# Patient Record
Sex: Female | Born: 1970 | Race: White | Hispanic: No | Marital: Single | Smoking: Never smoker
Health system: Southern US, Community
[De-identification: ages and names within clinical notes are randomized; demographics above are authoritative.]

## PROBLEM LIST (undated history)

## (undated) DIAGNOSIS — R569 Unspecified convulsions: Secondary | ICD-10-CM

## (undated) DIAGNOSIS — E669 Obesity, unspecified: Secondary | ICD-10-CM

## (undated) DIAGNOSIS — G43009 Migraine without aura, not intractable, without status migrainosus: Secondary | ICD-10-CM

## (undated) DIAGNOSIS — G40209 Localization-related (focal) (partial) symptomatic epilepsy and epileptic syndromes with complex partial seizures, not intractable, without status epilepticus: Secondary | ICD-10-CM

## (undated) DIAGNOSIS — K219 Gastro-esophageal reflux disease without esophagitis: Secondary | ICD-10-CM

## (undated) DIAGNOSIS — R413 Other amnesia: Secondary | ICD-10-CM

## (undated) HISTORY — DX: Localization-related (focal) (partial) symptomatic epilepsy and epileptic syndromes with complex partial seizures, not intractable, without status epilepticus: G40.209

## (undated) HISTORY — PX: TONSILLECTOMY: SUR1361

## (undated) HISTORY — DX: Migraine without aura, not intractable, without status migrainosus: G43.009

## (undated) HISTORY — DX: Gastro-esophageal reflux disease without esophagitis: K21.9

## (undated) HISTORY — DX: Unspecified convulsions: R56.9

## (undated) HISTORY — DX: Other amnesia: R41.3

## (undated) HISTORY — DX: Obesity, unspecified: E66.9

---

## 1997-11-21 ENCOUNTER — Emergency Department (HOSPITAL_COMMUNITY): Admission: EM | Admit: 1997-11-21 | Discharge: 1997-11-22 | Payer: Self-pay | Admitting: Emergency Medicine

## 1998-02-02 ENCOUNTER — Emergency Department (HOSPITAL_COMMUNITY): Admission: EM | Admit: 1998-02-02 | Discharge: 1998-02-02 | Payer: Self-pay | Admitting: Emergency Medicine

## 2002-09-26 ENCOUNTER — Emergency Department (HOSPITAL_COMMUNITY): Admission: EM | Admit: 2002-09-26 | Discharge: 2002-09-27 | Payer: Self-pay | Admitting: Emergency Medicine

## 2003-03-08 ENCOUNTER — Emergency Department (HOSPITAL_COMMUNITY): Admission: EM | Admit: 2003-03-08 | Discharge: 2003-03-09 | Payer: Self-pay | Admitting: Emergency Medicine

## 2003-03-08 ENCOUNTER — Encounter: Payer: Self-pay | Admitting: Emergency Medicine

## 2003-03-11 ENCOUNTER — Emergency Department (HOSPITAL_COMMUNITY): Admission: AD | Admit: 2003-03-11 | Discharge: 2003-03-11 | Payer: Self-pay | Admitting: Emergency Medicine

## 2003-07-07 ENCOUNTER — Emergency Department (HOSPITAL_COMMUNITY): Admission: EM | Admit: 2003-07-07 | Discharge: 2003-07-08 | Payer: Self-pay

## 2004-11-02 ENCOUNTER — Encounter: Admission: RE | Admit: 2004-11-02 | Discharge: 2004-11-02 | Payer: Self-pay | Admitting: Neurology

## 2004-11-14 ENCOUNTER — Encounter: Admission: RE | Admit: 2004-11-14 | Discharge: 2004-11-14 | Payer: Self-pay | Admitting: Neurology

## 2005-07-05 ENCOUNTER — Emergency Department (HOSPITAL_COMMUNITY): Admission: EM | Admit: 2005-07-05 | Discharge: 2005-07-05 | Payer: Self-pay | Admitting: *Deleted

## 2005-07-31 ENCOUNTER — Emergency Department (HOSPITAL_COMMUNITY): Admission: EM | Admit: 2005-07-31 | Discharge: 2005-08-01 | Payer: Self-pay | Admitting: Emergency Medicine

## 2005-08-07 ENCOUNTER — Encounter: Admission: RE | Admit: 2005-08-07 | Discharge: 2005-08-07 | Payer: Self-pay | Admitting: Neurology

## 2006-03-08 ENCOUNTER — Emergency Department (HOSPITAL_COMMUNITY): Admission: EM | Admit: 2006-03-08 | Discharge: 2006-03-08 | Payer: Self-pay | Admitting: Emergency Medicine

## 2006-07-04 ENCOUNTER — Encounter: Admission: RE | Admit: 2006-07-04 | Discharge: 2006-07-04 | Payer: Self-pay | Admitting: Occupational Medicine

## 2006-10-07 ENCOUNTER — Emergency Department (HOSPITAL_COMMUNITY): Admission: EM | Admit: 2006-10-07 | Discharge: 2006-10-07 | Payer: Self-pay | Admitting: Emergency Medicine

## 2006-10-09 ENCOUNTER — Emergency Department (HOSPITAL_COMMUNITY): Admission: EM | Admit: 2006-10-09 | Discharge: 2006-10-09 | Payer: Self-pay | Admitting: Emergency Medicine

## 2007-12-20 ENCOUNTER — Ambulatory Visit (HOSPITAL_COMMUNITY): Admission: RE | Admit: 2007-12-20 | Discharge: 2007-12-20 | Payer: Self-pay | Admitting: Gastroenterology

## 2008-01-03 ENCOUNTER — Ambulatory Visit (HOSPITAL_COMMUNITY): Admission: RE | Admit: 2008-01-03 | Discharge: 2008-01-03 | Payer: Self-pay | Admitting: Otolaryngology

## 2008-08-29 ENCOUNTER — Encounter: Admission: RE | Admit: 2008-08-29 | Discharge: 2008-08-29 | Payer: Self-pay | Admitting: Gastroenterology

## 2008-09-11 ENCOUNTER — Encounter: Admission: RE | Admit: 2008-09-11 | Discharge: 2008-09-11 | Payer: Self-pay | Admitting: Gastroenterology

## 2008-09-15 ENCOUNTER — Ambulatory Visit (HOSPITAL_COMMUNITY): Admission: RE | Admit: 2008-09-15 | Discharge: 2008-09-15 | Payer: Self-pay | Admitting: Gastroenterology

## 2008-09-29 ENCOUNTER — Ambulatory Visit (HOSPITAL_COMMUNITY): Admission: RE | Admit: 2008-09-29 | Discharge: 2008-09-29 | Payer: Self-pay | Admitting: Gastroenterology

## 2008-10-20 ENCOUNTER — Ambulatory Visit (HOSPITAL_COMMUNITY): Admission: RE | Admit: 2008-10-20 | Discharge: 2008-10-20 | Payer: Self-pay | Admitting: Gastroenterology

## 2008-12-02 ENCOUNTER — Encounter: Admission: RE | Admit: 2008-12-02 | Discharge: 2009-03-02 | Payer: Self-pay | Admitting: Gastroenterology

## 2010-06-07 ENCOUNTER — Encounter: Payer: Self-pay | Admitting: Gastroenterology

## 2012-08-17 ENCOUNTER — Telehealth: Payer: Self-pay

## 2012-08-17 MED ORDER — CARBAMAZEPINE ER 200 MG PO CP12: 200.0000 mg | ORAL_CAPSULE | Freq: Four times a day (QID) | ORAL | Status: DC

## 2012-08-17 MED ORDER — CARBAMAZEPINE ER 300 MG PO CP12: 300.0000 mg | ORAL_CAPSULE | Freq: Three times a day (TID) | ORAL | Status: DC

## 2012-08-17 NOTE — Telephone Encounter (Signed)
Patient called clinic requesting refills to last until appt in late August

## 2012-10-18 ENCOUNTER — Other Ambulatory Visit: Payer: Self-pay | Admitting: Neurology

## 2013-01-04 ENCOUNTER — Encounter: Payer: Self-pay | Admitting: Neurology

## 2013-01-04 ENCOUNTER — Other Ambulatory Visit: Payer: Self-pay | Admitting: Neurology

## 2013-01-04 ENCOUNTER — Ambulatory Visit (INDEPENDENT_AMBULATORY_CARE_PROVIDER_SITE_OTHER): Payer: 59 | Admitting: Neurology

## 2013-01-04 VITALS — BP 113/71 | HR 60 | Ht 62.5 in | Wt 202.0 lb

## 2013-01-04 DIAGNOSIS — G40209 Localization-related (focal) (partial) symptomatic epilepsy and epileptic syndromes with complex partial seizures, not intractable, without status epilepticus: Secondary | ICD-10-CM | POA: Insufficient documentation

## 2013-01-04 DIAGNOSIS — G43009 Migraine without aura, not intractable, without status migrainosus: Secondary | ICD-10-CM

## 2013-01-04 DIAGNOSIS — Z5181 Encounter for therapeutic drug level monitoring: Secondary | ICD-10-CM

## 2013-01-04 DIAGNOSIS — R413 Other amnesia: Secondary | ICD-10-CM

## 2013-01-04 HISTORY — DX: Migraine without aura, not intractable, without status migrainosus: G43.009

## 2013-01-04 HISTORY — DX: Localization-related (focal) (partial) symptomatic epilepsy and epileptic syndromes with complex partial seizures, not intractable, without status epilepticus: G40.209

## 2013-01-04 MED ORDER — RIZATRIPTAN BENZOATE 10 MG PO TABS: 10.0000 mg | ORAL_TABLET | Freq: Three times a day (TID) | ORAL | Status: DC | PRN

## 2013-01-04 MED ORDER — PROPRANOLOL HCL 10 MG PO TABS: ORAL_TABLET | ORAL | Status: DC

## 2013-01-04 MED ORDER — CARBAMAZEPINE ER 200 MG PO CP12: 200.0000 mg | ORAL_CAPSULE | Freq: Four times a day (QID) | ORAL | Status: DC

## 2013-01-04 MED ORDER — CARBAMAZEPINE ER 300 MG PO CP12: 300.0000 mg | ORAL_CAPSULE | Freq: Three times a day (TID) | ORAL | Status: DC

## 2013-01-04 NOTE — Progress Notes (Signed)
Reason for visit: Seizures  Anna Pugh is an 42 y.o. female  History of present illness:  Anna Pugh is a 42 year old left-handed white female with a history of seizures and migraine headache. The patient has general done well with her seizures. On Christmas Eve, she woke up the next morning with a headache and soreness on the arms and legs. The patient believes that she may have had a seizure event, but she is not sure. The patient has been under a lot of stress with the death of her brother from cancer. The patient has had financial issues as her co-pay for her carbamazepine has gone up dramatically with her new health insurance. The patient is having more frequent migraine headaches, on average 3 headaches a month. The patient will occasionally miss work because of the headache. The patient takes Maxalt which does help, and oftentimes shortness the duration of the headache around 2 hours. The headaches may last up to 5 hours. The headaches will begin with nausea, and a numbness sensation of both legs and a squeezing sensation in the legs prior to the onset of the headache. The patient returns for an evaluation. The patient reports some problems with memory that has been present for one or 2 years.  Past Medical History  Diagnosis Date  . Seizures   . Obesity   . GERD (gastroesophageal reflux disease)   . Localization-related (focal) (partial) epilepsy and epileptic syndromes with complex partial seizures, without mention of intractable epilepsy 01/04/2013  . Migraine without aura, without mention of intractable migraine without mention of status migrainosus 01/04/2013  . Memory deficit     Past Surgical History  Procedure Laterality Date  . Tonsillectomy      Family History  Problem Relation Age of Onset  . Migraines Father   . Cancer Brother     Social history:  reports that she has never smoked. She does not have any smokeless tobacco history on file. She reports that she does  not drink alcohol or use illicit drugs.    Allergies  Allergen Reactions  . Keppra [Levetiracetam]   . Lamictal [Lamotrigine]   . Topamax [Topiramate]     Medications:  No current outpatient prescriptions on file prior to visit.   No current facility-administered medications on file prior to visit.    ROS:  Out of a complete 14 system review of symptoms, the patient complains only of the following symptoms, and all other reviewed systems are negative.  Memory loss Numbness Headache Seizures  Blood pressure 113/71, pulse 60, height 5' 2.5" (1.588 m), weight 202 lb (91.627 kg).  Physical Exam  General: The patient is alert and cooperative at the time of the examination. The patient is minimally to moderately obese.  Skin: No significant peripheral edema is noted.   Neurologic Exam  Cranial nerves: Facial symmetry is present. Speech is normal, no aphasia or dysarthria is noted. Extraocular movements are full. Visual fields are full.  Motor: The patient has good strength in all 4 extremities.  Coordination: The patient has good finger-nose-finger and heel-to-shin bilaterally. Some apraxia with the use of the extremities is noted.  Gait and station: The patient has a normal gait. Tandem gait is normal. Romberg is negative. No drift is seen.  Reflexes: Deep tendon reflexes are symmetric.   Assessment/Plan:  1. History seizures  2. Migraine headache  3. Memory disturbance  The patient will be set up for some blood work today. The patient was given a form for  patient assistance with the medications. The patient will be placed on Inderal for her migraine headache. The patient followup in about one year. Prescription for Maxalt was also given.  Marlan Palau MD 01/05/2013 8:43 AM  [facility] [address] [place], [place] [address]  Phone [phone] Fax [phone]

## 2013-01-15 LAB — COMPREHENSIVE METABOLIC PANEL
Alkaline Phosphatase: 82 IU/L (ref 39–117)
BUN/Creatinine Ratio: 19 (ref 9–23)
BUN: 14 mg/dL (ref 6–24)
CO2: 23 mmol/L (ref 18–29)
GFR calc Af Amer: 119 mL/min/{1.73_m2} (ref >59)
Globulin, Total: 2.5 g/dL (ref 1.5–4.5)
Glucose: 97 mg/dL (ref 65–99)
Sodium: 138 mmol/L (ref 134–144)

## 2013-01-15 LAB — CBC WITH DIFFERENTIAL
HCT: 36.5 % (ref 34.0–46.6)
Hemoglobin: 11.8 g/dL (ref 11.1–15.9)
Lymphs: 27 % (ref 14–46)
MCHC: 32.3 g/dL (ref 31.5–35.7)
MCV: 88 fL (ref 79–97)
Neutrophils Relative %: 60 % (ref 40–74)
RBC: 4.16 x10E6/uL (ref 3.77–5.28)
RDW: 14.1 % (ref 12.3–15.4)

## 2013-01-15 LAB — VITAMIN B12: Vitamin B-12: 447 pg/mL (ref 211–946)

## 2013-01-15 LAB — TSH: TSH: 2.79 u[IU]/mL (ref 0.450–4.500)

## 2013-01-15 NOTE — Progress Notes (Signed)
Quick Note:  I called and LMVM for pt that labs normal. To call back if questions. ______

## 2013-09-04 ENCOUNTER — Other Ambulatory Visit: Payer: Self-pay | Admitting: Neurology

## 2014-01-03 ENCOUNTER — Encounter: Payer: Self-pay | Admitting: Neurology

## 2014-01-03 ENCOUNTER — Ambulatory Visit (INDEPENDENT_AMBULATORY_CARE_PROVIDER_SITE_OTHER): Payer: BC Managed Care – PPO | Admitting: Neurology

## 2014-01-03 VITALS — BP 110/70 | HR 72 | Ht 63.5 in | Wt 209.0 lb

## 2014-01-03 DIAGNOSIS — G40209 Localization-related (focal) (partial) symptomatic epilepsy and epileptic syndromes with complex partial seizures, not intractable, without status epilepticus: Secondary | ICD-10-CM

## 2014-01-03 DIAGNOSIS — Z5181 Encounter for therapeutic drug level monitoring: Secondary | ICD-10-CM

## 2014-01-03 DIAGNOSIS — G43009 Migraine without aura, not intractable, without status migrainosus: Secondary | ICD-10-CM

## 2014-01-03 LAB — CBC WITH DIFFERENTIAL
BASOS: 0 %
Basophils Absolute: 0 10*3/uL (ref 0.0–0.2)
EOS ABS: 0.2 10*3/uL (ref 0.0–0.4)
EOS: 3 %
HCT: 35.7 % (ref 34.0–46.6)
Hemoglobin: 12.6 g/dL (ref 11.1–15.9)
LYMPHS ABS: 1.3 10*3/uL (ref 0.7–3.1)
Lymphs: 16 %
MCH: 30.2 pg (ref 26.6–33.0)
MCHC: 35.3 g/dL (ref 31.5–35.7)
MCV: 86 fL (ref 79–97)
MONOS ABS: 0.9 10*3/uL (ref 0.1–0.9)
Monocytes: 12 %
NEUTROS PCT: 69 %
Neutrophils Absolute: 5.3 10*3/uL (ref 1.4–7.0)
PLATELETS: 308 10*3/uL (ref 150–379)
RBC: 4.17 x10E6/uL (ref 3.77–5.28)
RDW: 13.4 % (ref 12.3–15.4)
WBC: 7.8 10*3/uL (ref 3.4–10.8)

## 2014-01-03 LAB — CARBAMAZEPINE LEVEL, TOTAL: Carbamazepine Lvl: 10.6 ug/mL (ref 4.0–12.0)

## 2014-01-03 LAB — COMPREHENSIVE METABOLIC PANEL
A/G RATIO: 1.3 (ref 1.1–2.5)
ALBUMIN: 4 g/dL (ref 3.5–5.5)
ALK PHOS: 100 IU/L (ref 39–117)
ALT: 19 IU/L (ref 0–32)
AST: 18 IU/L (ref 0–40)
BUN/Creatinine Ratio: 15 (ref 9–23)
BUN: 10 mg/dL (ref 6–24)
CO2: 24 mmol/L (ref 18–29)
Calcium: 8.6 mg/dL — ABNORMAL LOW (ref 8.7–10.2)
Chloride: 102 mmol/L (ref 96–108)
Creatinine, Ser: 0.68 mg/dL (ref 0.57–1.00)
GFR calc Af Amer: 124 mL/min/{1.73_m2} (ref >59)
GFR calc non Af Amer: 107 mL/min/{1.73_m2} (ref >59)
GLOBULIN, TOTAL: 3 g/dL (ref 1.5–4.5)
GLUCOSE: 82 mg/dL (ref 65–99)
POTASSIUM: 4.4 mmol/L (ref 3.5–5.2)
Sodium: 138 mmol/L (ref 134–144)
Total Bilirubin: 0.2 mg/dL (ref 0.0–1.2)
Total Protein: 7 g/dL (ref 6.0–8.5)

## 2014-01-03 MED ORDER — CARBAMAZEPINE ER 300 MG PO CP12: 300.0000 mg | ORAL_CAPSULE | Freq: Two times a day (BID) | ORAL | Status: DC

## 2014-01-03 MED ORDER — CARBAMAZEPINE ER 200 MG PO CP12: 200.0000 mg | ORAL_CAPSULE | Freq: Two times a day (BID) | ORAL | Status: DC

## 2014-01-03 MED ORDER — RIZATRIPTAN BENZOATE 10 MG PO TABS: 10.0000 mg | ORAL_TABLET | Freq: Three times a day (TID) | ORAL | Status: DC | PRN

## 2014-01-03 NOTE — Patient Instructions (Signed)

## 2014-01-03 NOTE — Progress Notes (Signed)
    Reason for visit: Seizures  Anna Pugh is an 43 y.o. female  History of present illness:  Anna Pugh is a 43 year old left-handed white female with a history of seizures and migraine headaches. The patient has done quite well over the last one year, she indicates that she essentially has not had any migraine, and she has not had any seizures. The patient is on [name] taking 500 mg twice daily. She was placed on Inderal for her migraines, but she never took the medication. The patient has been exercising on a regular basis, and overall feels better. She denies any other new medical issues that have come up since last seen. Her father was recently diagnosed with myasthenia gravis.  Past Medical History  Diagnosis Date  . Seizures   . Obesity   . GERD (gastroesophageal reflux disease)   . Localization-related (focal) (partial) epilepsy and epileptic syndromes with complex partial seizures, without mention of intractable epilepsy 01/04/2013  . Migraine without aura, without mention of intractable migraine without mention of status migrainosus 01/04/2013  . Memory deficit     Past Surgical History  Procedure Laterality Date  . Tonsillectomy      Family History  Problem Relation Age of Onset  . Migraines Father   . Myasthenia gravis Father   . Cancer Brother     Social history:  reports that she has never smoked. She has never used smokeless tobacco. She reports that she does not drink alcohol or use illicit drugs.    Allergies  Allergen Reactions  . Keppra [Levetiracetam]   . Lamictal [Lamotrigine]   . Topamax [Topiramate]     Medications:  No current outpatient prescriptions on file prior to visit.   No current facility-administered medications on file prior to visit.    ROS:  Out of a complete 14 system review of symptoms, the patient complains only of the following symptoms, and all other reviewed systems are negative.  History of headache  Blood pressure  110/70, pulse 72, height 5' 3.5" (1.613 m), weight 209 lb (94.802 kg).  Physical Exam  General: The patient is alert and cooperative at the time of the examination. The patient is moderately obese.  Skin: No significant peripheral edema is noted.   Neurologic Exam  Mental status: The patient is oriented x 3.  Cranial nerves: Facial symmetry is present. Speech is normal, no aphasia or dysarthria is noted. Extraocular movements are full. Visual fields are full.  Motor: The patient has good strength in all 4 extremities.  Sensory examination: Soft touch sensation is symmetric on the face, arms, and legs.  Coordination: The patient has good finger-nose-finger and heel-to-shin bilaterally.  Gait and station: The patient has a normal gait. Tandem gait is normal. Romberg is negative. No drift is seen.  Reflexes: Deep tendon reflexes are symmetric.   Assessment/Plan:  1. Migraine headache  2. History seizures  The patient doing well on [name] currently. The patient has Maxalt to take for her headaches. Prescriptions were given for these medications. Blood work will be done today. The patient will followup in one year or as needed.  Marlan Palau. Keith Willis MD 01/03/2014 8:26 AM  [facility] [address] [place], [place] [id number]  Phone [phone] Fax [phone]

## 2014-01-06 ENCOUNTER — Telehealth: Payer: Self-pay | Admitting: *Deleted

## 2014-01-06 ENCOUNTER — Telehealth: Payer: Self-pay | Admitting: Neurology

## 2014-01-06 NOTE — Telephone Encounter (Signed)
Blood work is relatively unremarkable, good carbamazepine levels, minimally low calcium level. No change in dosing of carbamazepine. Please call the patient.   Message left for patient to return call.

## 2014-01-06 NOTE — Telephone Encounter (Signed)
Patient is returning your call please call

## 2014-01-06 NOTE — Telephone Encounter (Signed)
Informed patient of lab results

## 2015-01-06 ENCOUNTER — Ambulatory Visit: Payer: BC Managed Care – PPO | Admitting: Adult Health

## 2015-01-06 ENCOUNTER — Encounter: Payer: Self-pay | Admitting: Adult Health

## 2015-01-06 ENCOUNTER — Ambulatory Visit (INDEPENDENT_AMBULATORY_CARE_PROVIDER_SITE_OTHER): Payer: PRIVATE HEALTH INSURANCE | Admitting: Adult Health

## 2015-01-06 VITALS — BP 112/70 | HR 68 | Ht 63.0 in | Wt 209.0 lb

## 2015-01-06 DIAGNOSIS — Z5181 Encounter for therapeutic drug level monitoring: Secondary | ICD-10-CM | POA: Diagnosis not present

## 2015-01-06 DIAGNOSIS — G43809 Other migraine, not intractable, without status migrainosus: Secondary | ICD-10-CM

## 2015-01-06 DIAGNOSIS — R569 Unspecified convulsions: Secondary | ICD-10-CM | POA: Diagnosis not present

## 2015-01-06 DIAGNOSIS — G43009 Migraine without aura, not intractable, without status migrainosus: Secondary | ICD-10-CM

## 2015-01-06 NOTE — Patient Instructions (Addendum)
Continue [name]. Check blood work today Let us know if you have any additional migraine events If your symptoms worsen or you develop new symptoms please let us know.

## 2015-01-06 NOTE — Progress Notes (Addendum)
PATIENT: Hanaan Gancarz DOB: 06-05-1970  REASON FOR VISIT: follow up- migraine headaches, seizures HISTORY FROM: patient  HISTORY OF PRESENT ILLNESS: Ms. Debruyne is a 44 year old female with a history of seizures and migraine headaches. The patient has been on [name] taking 500 mg twice a day. She reports that this has been working well for her. However in the last week she has had 3 migraine headaches. She states that her first headache occurred last Tuesday while at work. She became lightheaded, confused and had a headache. She states she also had the sensation like her face is being pushed to the left. She states that her migraines have always been atypical however this migraine she had confusion where as before she has not. She states that the confusion lasted for approximately 45 minutes. Her coworkers states that she did not have any shaking or trembling in the extremities. She states then on Sunday she had a migraine headache she states that in addition to the headaches she had the sensation that someone was squeezing her right arm. She was able to take Maxalt and her symptoms resolved. She states that a couple hours later the headache returned and she had more of a numbness in the right arm. She states that after her migraines she is typically very sleepy. The patient states in the past when she's had a seizure it is usually precipitated by an aura that consists of a strobe light in the right eye. She will then feel as if her face is being pushed to the left and she will have convulsing  in the extremities. She states that she typically cannot remember having the seizure. She states that in the past week she has been very stressed with her job. In the past she is also had increase in migraine headaches when she is under a lot of stress. She denies any new symptoms. She returns today for an evaluation.  HISTORY 01/03/14: Ms. Mchaney is a 44 year old left-handed white female with a history of  seizures and migraine headaches. The patient has done quite well over the last one year, she indicates that she essentially has not had any migraine, and she has not had any seizures. The patient is on [name] taking 500 mg twice daily. She was placed on Inderal for her migraines, but she never took the medication. The patient has been exercising on a regular basis, and overall feels better. She denies any other new medical issues that have come up since last seen. Her father was recently diagnosed with myasthenia gravis  REVIEW OF SYSTEMS: Out of a complete 14 system review of symptoms, the patient complains only of the following symptoms, and all other reviewed systems are negative.  Dizziness, headache, numbness, weakness, confusion  ALLERGIES: Allergies  Allergen Reactions  . Keppra [Levetiracetam]   . Lamictal [Lamotrigine]   . Topamax [Topiramate]     HOME MEDICATIONS: Outpatient Prescriptions Prior to Visit  Medication Sig Dispense Refill  . carbamazepine ([name]) 200 MG 12 hr capsule Take 1 capsule (200 mg total) by mouth 2 (two) times daily. 60 capsule 11  . carbamazepine ([name]) 300 MG 12 hr capsule Take 1 capsule (300 mg total) by mouth 2 (two) times daily. 60 capsule 11  . rizatriptan (MAXALT) 10 MG tablet Take 1 tablet (10 mg total) by mouth 3 (three) times daily as needed for migraine. May repeat in 2 hours if needed 12 tablet 5   No facility-administered medications prior to visit.    PAST  MEDICAL HISTORY: Past Medical History  Diagnosis Date  . Seizures   . Obesity   . GERD (gastroesophageal reflux disease)   . Localization-related (focal) (partial) epilepsy and epileptic syndromes with complex partial seizures, without mention of intractable epilepsy 01/04/2013  . Migraine without aura, without mention of intractable migraine without mention of status migrainosus 01/04/2013  . Memory deficit     PAST SURGICAL HISTORY: Past Surgical History  Procedure  Laterality Date  . Tonsillectomy      FAMILY HISTORY: Family History  Problem Relation Age of Onset  . Migraines Father   . Myasthenia gravis Father   . Cancer Brother     SOCIAL HISTORY: Social History   Social History  . Marital Status: Single    Spouse Name: N/A  . Number of Children: 0  . Years of Education: Bachelor   Occupational History  .      Employed by [employer]   Social History Main Topics  . Smoking status: Never Smoker   . Smokeless tobacco: Never Used  . Alcohol Use: No  . Drug Use: No  . Sexual Activity: Not on file   Other Topics Concern  . Not on file   Social History Narrative   Patient is single with no children.   Patient is left handed.   Patient has a Bachelor's degree.   Patient drinks 3 cups daily.      PHYSICAL EXAM  Filed Vitals:   01/06/15 1601  BP: 112/70  Pulse: 68  Height:  (1.6 m)  Weight: 209 lb (94.802 kg)   Body mass index is 37.03 kg/(m^2).  Generalized: Well developed, in no acute distress   Neurological examination  Mentation: Alert oriented to time, place, history taking. Follows all commands speech and language fluent Cranial nerve II-XII: Pupils were equal round reactive to light. Extraocular movements were full, visual field were full on confrontational test. Facial sensation and strength were normal. Uvula tongue midline. Head turning and shoulder shrug  were normal and symmetric. Motor: The motor testing reveals 5 over 5 strength of all 4 extremities. Good symmetric motor tone is noted throughout.  Sensory: Sensory testing is intact to soft touch on all 4 extremities. No evidence of extinction is noted.  Coordination: Cerebellar testing reveals good finger-nose-finger and heel-to-shin bilaterally.  Gait and station: Gait is normal. Tandem gait is normal. Romberg is negative. No drift is seen.  Reflexes: Deep tendon reflexes are symmetric and normal bilaterally.   DIAGNOSTIC DATA (LABS, IMAGING,  TESTING) - I reviewed patient records, labs, notes, testing and imaging myself where available.  Lab Results  Component Value Date   WBC 7.8 01/03/2014   HGB 12.6 01/03/2014   HCT 35.7 01/03/2014   MCV 86 01/03/2014   PLT 308 01/03/2014      Component Value Date/Time   NA 138 01/03/2014 0825   K 4.4 01/03/2014 0825   CL 102 01/03/2014 0825   CO2 24 01/03/2014 0825   GLUCOSE 82 01/03/2014 0825   BUN 10 01/03/2014 0825   CREATININE 0.68 01/03/2014 0825   CALCIUM 8.6* 01/03/2014 0825   PROT 7.0 01/03/2014 0825   AST 18 01/03/2014 0825   ALT 19 01/03/2014 0825   ALKPHOS 100 01/03/2014 0825   BILITOT 0.2 01/03/2014 0825   GFRNONAA 107 01/03/2014 0825   GFRAA 124 01/03/2014 0825       ASSESSMENT AND PLAN 45 y.o. year old female  has a past medical history of Seizures; Obesity; GERD (gastroesophageal  reflux disease); Localization-related (focal) (partial) epilepsy and epileptic syndromes with complex partial seizures, without mention of intractable epilepsy (01/04/2013); Migraine without aura, without mention of intractable migraine without mention of status migrainosus (01/04/2013); and Memory deficit. here with:  1. Atypical migraines 2. Seizures  The patient has had more migraine events in the last week. Her migraines are very atypical in nature. The patient is unsure if her first event was a true migraine or seizure. She states that some of the symptoms didn't mimic her typical seizure event. Although she did not have convulsing that has been present in the past. I will check blood work today. Pending her carbamazepine level we may consider increasing her dose. The patient advised that if she has any additional events she should let us know. She will follow-up in 6 months or sooner if needed.     Butch Penny, MSN, NP-C 01/06/2015, 4:12 PM [facility] [address], [address] [place], [place] [address] [phone]

## 2015-01-07 ENCOUNTER — Telehealth: Payer: Self-pay | Admitting: Adult Health

## 2015-01-07 LAB — COMPREHENSIVE METABOLIC PANEL
ALBUMIN: 4.3 g/dL (ref 3.5–5.5)
ALK PHOS: 100 IU/L (ref 39–117)
ALT: 24 IU/L (ref 0–32)
AST: 20 IU/L (ref 0–40)
Albumin/Globulin Ratio: 1.3 (ref 1.1–2.5)
BUN / CREAT RATIO: 14 (ref 9–23)
BUN: 10 mg/dL (ref 6–24)
CHLORIDE: 98 mmol/L (ref 97–108)
CO2: 20 mmol/L (ref 18–29)
CREATININE: 0.7 mg/dL (ref 0.57–1.00)
Calcium: 9.3 mg/dL (ref 8.7–10.2)
GFR calc non Af Amer: 106 mL/min/{1.73_m2} (ref >59)
GFR, EST AFRICAN AMERICAN: 122 mL/min/{1.73_m2} (ref >59)
Globulin, Total: 3.2 g/dL (ref 1.5–4.5)
Glucose: 91 mg/dL (ref 65–99)
Potassium: 4 mmol/L (ref 3.5–5.2)
Sodium: 138 mmol/L (ref 134–144)
Total Protein: 7.5 g/dL (ref 6.0–8.5)

## 2015-01-07 LAB — CBC WITH DIFFERENTIAL/PLATELET
Basophils Absolute: 0 10*3/uL (ref 0.0–0.2)
Basos: 1 %
EOS (ABSOLUTE): 0.1 10*3/uL (ref 0.0–0.4)
Eos: 1 %
HEMOGLOBIN: 12.9 g/dL (ref 11.1–15.9)
Hematocrit: 38.8 % (ref 34.0–46.6)
IMMATURE GRANS (ABS): 0 10*3/uL (ref 0.0–0.1)
Immature Granulocytes: 0 %
LYMPHS: 25 %
Lymphocytes Absolute: 2 10*3/uL (ref 0.7–3.1)
MCH: 29.3 pg (ref 26.6–33.0)
MCHC: 33.2 g/dL (ref 31.5–35.7)
MCV: 88 fL (ref 79–97)
MONOCYTES: 10 %
Monocytes Absolute: 0.8 10*3/uL (ref 0.1–0.9)
NEUTROS ABS: 5 10*3/uL (ref 1.4–7.0)
Neutrophils: 63 %
Platelets: 373 10*3/uL (ref 150–379)
RBC: 4.41 x10E6/uL (ref 3.77–5.28)
RDW: 13.7 % (ref 12.3–15.4)
WBC: 8 10*3/uL (ref 3.4–10.8)

## 2015-01-07 LAB — CARBAMAZEPINE LEVEL, TOTAL: Carbamazepine Lvl: 9.8 ug/mL (ref 4.0–12.0)

## 2015-01-07 NOTE — Telephone Encounter (Signed)
I called the patient. I left a message for her to call the office at her convenience. Patient's lab work was normal however we do need to increase her [name].

## 2015-01-08 ENCOUNTER — Telehealth: Payer: Self-pay | Admitting: Adult Health

## 2015-01-08 MED ORDER — CARBAMAZEPINE ER 300 MG PO CP12: 300.0000 mg | ORAL_CAPSULE | Freq: Two times a day (BID) | ORAL | Status: DC

## 2015-01-08 MED ORDER — CARBAMAZEPINE ER 200 MG PO CP12: ORAL_CAPSULE | ORAL | Status: DC

## 2015-01-08 NOTE — Telephone Encounter (Addendum)
The patient called back. I informed her that her lab work was normal. She is having increase in migraine headaches. We will increase the [name] taking 500 mg total in the morning and 700 mg total in the evening. Patient will let me know if she continues to have the migraine-type events. Patient needs to have a follow-up appt scheduled in 3-4 months.

## 2015-01-08 NOTE — Telephone Encounter (Signed)
Called patient and left her a message asking her to call the office back . Patient needs to be scheduled for a follow up in December with [facility]. If patient calls back please schedule her a December apt. With [facility]. Thanks Annabelle Harman.

## 2015-01-08 NOTE — Addendum Note (Signed)
Addended by: Enedina Finner on: 01/08/2015 08:48 AM   Modules accepted: Orders

## 2015-01-08 NOTE — Telephone Encounter (Signed)
I called the patient and left a message asking for her to call the office. I will try again this afternoon. If unable to reach her we will send a letter.

## 2015-01-12 ENCOUNTER — Telehealth: Payer: Self-pay | Admitting: Adult Health

## 2015-01-12 MED ORDER — RIZATRIPTAN BENZOATE 10 MG PO TABS: 10.0000 mg | ORAL_TABLET | Freq: Three times a day (TID) | ORAL | Status: DC | PRN

## 2015-01-12 NOTE — Telephone Encounter (Signed)
Patient called to request refill on rizatriptan (MAXALT) 10 MG tablet. Was just seen on 01/06/15 and has 1m f/u 05/07/15.

## 2015-01-12 NOTE — Telephone Encounter (Signed)
Rx has been sent  

## 2015-05-07 ENCOUNTER — Ambulatory Visit: Payer: PRIVATE HEALTH INSURANCE | Admitting: Adult Health

## 2015-05-12 ENCOUNTER — Encounter: Payer: Self-pay | Admitting: Adult Health

## 2015-06-03 ENCOUNTER — Ambulatory Visit (INDEPENDENT_AMBULATORY_CARE_PROVIDER_SITE_OTHER): Payer: PRIVATE HEALTH INSURANCE | Admitting: Physician Assistant

## 2015-06-03 VITALS — BP 124/76 | HR 64 | Temp 98.2°F | Resp 20 | Ht 63.0 in | Wt 217.8 lb

## 2015-06-03 DIAGNOSIS — M79671 Pain in right foot: Secondary | ICD-10-CM

## 2015-06-03 MED ORDER — IBUPROFEN 600 MG PO TABS: 600.0000 mg | ORAL_TABLET | Freq: Three times a day (TID) | ORAL | Status: DC | PRN

## 2015-06-03 NOTE — Patient Instructions (Signed)
Elastic Bandage and RICE °WHAT DOES AN ELASTIC BANDAGE DO? °Elastic bandages come in different shapes and sizes. They generally provide support to your injury and reduce swelling while you are healing, but they can perform different functions. Your health care provider will help you to decide what is best for your protection, recovery, or rehabilitation following an injury. °WHAT ARE SOME GENERAL TIPS FOR USING AN ELASTIC BANDAGE? °· Use the bandage as directed by the maker of the bandage that you are using. °· Do not wrap the bandage too tightly. This may cut off the circulation in the arm or leg in the area below the bandage. °¨ If part of your body beyond the bandage becomes blue, numb, cold, swollen, or is more painful, your bandage is most likely too tight. If this occurs, remove your bandage and reapply it more loosely. °· See your health care provider if the bandage seems to be making your problems worse rather than better. °· An elastic bandage should be removed and reapplied every 3-4 hours or as directed by your health care provider. °WHAT IS RICE? °The routine care of many injuries includes rest, ice, compression, and elevation (RICE therapy).  °Rest °Rest is required to allow your body to heal. Generally, you can resume your routine activities when you are comfortable and have been given permission by your health care provider. °Ice °Icing your injury helps to keep the swelling down and it reduces pain. Do not apply ice directly to your skin. °· Put ice in a plastic bag. °· Place a towel between your skin and the bag. °· Leave the ice on for 20 minutes, 2-3 times per day. °Do this for as long as you are directed by your health care provider. °Compression °Compression helps to keep swelling down, gives support, and helps with discomfort. Compression may be done with an elastic bandage. °Elevation °Elevation helps to reduce swelling and it decreases pain. If possible, your injured area should be placed at  or above the level of your heart or the center of your chest. °WHEN SHOULD I SEEK MEDICAL CARE? °You should seek medical care if: °· You have persistent pain and swelling. °· Your symptoms are getting worse rather than improving. °These symptoms may indicate that further evaluation or further X-rays are needed. Sometimes, X-rays may not show a small broken bone (fracture) until a number of days later. Make a follow-up appointment with your health care provider. Ask when your X-ray results will be ready. Make sure that you get your X-ray results. °WHEN SHOULD I SEEK IMMEDIATE MEDICAL CARE? °You should seek immediate medical care if: °· You have a sudden onset of severe pain at or below the area of your injury. °· You develop redness or increased swelling around your injury. °· You have tingling or numbness at or below the area of your injury that does not improve after you remove the elastic bandage. °  °This information is not intended to replace advice given to you by your health care provider. Make sure you discuss any questions you have with your health care provider. °  °Document Released: 10/22/2001 Document Revised: 01/21/2015 Document Reviewed: 12/16/2013 °Elsevier Interactive Patient Education ©2016 [employer] ° °

## 2015-06-03 NOTE — Progress Notes (Signed)
06/03/2015 6:39 PM   DOB: 09-27-1970 / MRN: [id number]  SUBJECTIVE:  Anna Pugh is a 45 y.o. female presenting for right sided heal pain that has started roughly 2 weeks ago.  She has recently started exercising more often with the New Year and reports the pain occurs only after exercise and with ambulation. She just got an elliptical and reports that she can not tolerate more than ten minutes now.  No history of osteoporosis/penia, she is still menstruating and her mother has no history of bone demineralization.  She has tried tylenol with some relief.  She does have a bike at home that she can substitute to get physical activity.    She is allergic to keppra; lamictal; and topamax.   She  has a past medical history of Seizures (HCC); Obesity; GERD (gastroesophageal reflux disease); Localization-related (focal) (partial) epilepsy and epileptic syndromes with complex partial seizures, without mention of intractable epilepsy (01/04/2013); Migraine without aura, without mention of intractable migraine without mention of status migrainosus (01/04/2013); and Memory deficit.    She  reports that she has never smoked. She has never used smokeless tobacco. She reports that she does not drink alcohol or use illicit drugs. She  has no sexual activity history on file. The patient  has past surgical history that includes Tonsillectomy.  Her family history includes Cancer in her brother; Migraines in her father; Myasthenia gravis in her father.  Review of Systems  Constitutional: Negative for fever and chills.  Eyes: Negative for blurred vision.  Respiratory: Negative for cough and shortness of breath.   Cardiovascular: Negative for chest pain.  Gastrointestinal: Negative for nausea and abdominal pain.  Genitourinary: Negative for dysuria, urgency and frequency.  Musculoskeletal: Negative for myalgias.  Skin: Negative for rash.  Neurological: Negative for dizziness, tingling and headaches.    Psychiatric/Behavioral: Negative for depression. The patient is not nervous/anxious.     Problem list and medications reviewed and updated by myself where necessary, and exist elsewhere in the encounter.   OBJECTIVE:  BP 124/76 mmHg  Pulse 64  Temp(Src) 98.2 F (36.8 C) (Oral)  Resp 20  Ht  (1.6 m)  Wt 217 lb 12.8 oz (98.793 kg)  BMI 38.59 kg/m2  SpO2 98%  LMP 05/27/2015  Physical Exam  Constitutional: She is oriented to person, place, and time. She appears well-developed and well-nourished. No distress.  Eyes: EOM are normal. Pupils are equal, round, and reactive to light.  Cardiovascular: Normal rate and regular rhythm.   Pulmonary/Chest: Effort normal.  Abdominal: She exhibits no distension.  Musculoskeletal:       Right ankle: She exhibits normal range of motion, no swelling, no ecchymosis and no deformity. No tenderness.       Right foot: There is normal range of motion, no tenderness, no bony tenderness and no swelling.  Neurological: She is alert and oriented to person, place, and time. No cranial nerve deficit. Gait normal.  Skin: Skin is dry. She is not diaphoretic.  Psychiatric: She has a normal mood and affect.  Vitals reviewed.   No results found for this or any previous visit (from the past 48 hour(s)).  ASSESSMENT AND PLAN  [name] was seen today for foot injury and flu vaccine.  Diagnoses and all orders for this visit:  Heel pain, right: Doubt fracture of any kind.  She has just increased her exercise and really only has pain with the elliptical, and reports there have been times she can walk without difficulty.  Advised that she observe RICE and Ibuprofen.    -     ibuprofen (ADVIL,MOTRIN) 600 MG tablet; Take 1 tablet (600 mg total) by mouth every 8 (eight) hours as needed.    The patient was advised to call or return to clinic if she does not see an improvement in symptoms or to seek the care of the closest emergency department if she worsens with the  above plan.   Deliah Boston, MHS, PA-C [facility] 06/03/2015 6:39 PM

## 2015-06-17 ENCOUNTER — Encounter: Payer: Self-pay | Admitting: Podiatry

## 2015-06-17 ENCOUNTER — Ambulatory Visit (INDEPENDENT_AMBULATORY_CARE_PROVIDER_SITE_OTHER): Payer: PRIVATE HEALTH INSURANCE

## 2015-06-17 ENCOUNTER — Ambulatory Visit (INDEPENDENT_AMBULATORY_CARE_PROVIDER_SITE_OTHER): Payer: PRIVATE HEALTH INSURANCE | Admitting: Podiatry

## 2015-06-17 VITALS — BP 101/73 | HR 58 | Resp 16 | Ht 62.0 in | Wt 112.0 lb

## 2015-06-17 DIAGNOSIS — M722 Plantar fascial fibromatosis: Secondary | ICD-10-CM | POA: Diagnosis not present

## 2015-06-17 DIAGNOSIS — M79671 Pain in right foot: Secondary | ICD-10-CM

## 2015-06-17 MED ORDER — TRIAMCINOLONE ACETONIDE 10 MG/ML IJ SUSP
10.0000 mg | Freq: Once | INTRAMUSCULAR | Status: AC
  Administered 2015-06-17: 10 mg

## 2015-06-17 NOTE — Progress Notes (Signed)
Subjective:     Patient ID: Anna Pugh, female   DOB: April 22, 1971, 45 y.o.   MRN: [id number]  HPI patient presents stating I'm having a lot of pain in my right heel of a month nature and I am very active and try to walk an try to do different activities   Review of Systems  All other systems reviewed and are negative.      Objective:   Physical Exam  Constitutional: She is oriented to person, place, and time.  Cardiovascular: Intact distal pulses.   Musculoskeletal: Normal range of motion.  Neurological: She is oriented to person, place, and time.  Skin: Skin is warm.  Nursing note and vitals reviewed.  neurovascular status intact muscle strength adequate with range of motion within normal limits. Patient's noted to have significant discomfort in the right plantar fascia at the insertion of the tendon the calcaneus with fluid buildup around the medial band and is noted to have mild depression of the arch with good digital perfusion and well oriented 3     Assessment:      plantar fasciitis right with inflammation and fluid around the medial band    Plan:      H&P and x-rays reviewed with patient. Injected the right plantar fascia 3 mg Kenalog 5 mill grams Xylocaine and applied fascial brace with instructions on usage along with physical therapy and continuation of the anti-inflammatory that she's taking

## 2015-06-17 NOTE — Patient Instructions (Signed)

## 2015-06-17 NOTE — Progress Notes (Signed)
   Subjective:    Patient ID: Anna Pugh, female    DOB: Nov 28, 1970, 45 y.o.   MRN: [id number]  HPI Patient presents with foot pain in their right foot; heel; x1 month.   Review of Systems  All other systems reviewed and are negative.      Objective:   Physical Exam        Assessment & Plan:

## 2015-07-02 ENCOUNTER — Ambulatory Visit (INDEPENDENT_AMBULATORY_CARE_PROVIDER_SITE_OTHER): Payer: PRIVATE HEALTH INSURANCE | Admitting: Podiatry

## 2015-07-02 ENCOUNTER — Encounter: Payer: Self-pay | Admitting: Podiatry

## 2015-07-02 VITALS — BP 114/74 | HR 58 | Resp 12 | Ht 62.0 in | Wt 212.0 lb

## 2015-07-02 DIAGNOSIS — M722 Plantar fascial fibromatosis: Secondary | ICD-10-CM | POA: Diagnosis not present

## 2015-07-03 NOTE — Progress Notes (Signed)
Subjective:     Patient ID: Anna Pugh, female   DOB: Oct 31, 1970, 45 y.o.   MRN: [id number]  HPI patient states him in prove quite a bit with discomfort if him still on it a lot but much better   Review of Systems     Objective:   Physical Exam Neurovascular status intact with significant improvement of the left plantar fascia with pain still present upon deep palpation but much better    Assessment:     Improved plantar fasciitis right    Plan:     Instructed on physical therapy anti-inflammatories and not going barefoot. Patient will modify shoes and will be seen back if symptoms persist for consideration of orthotic casting

## 2015-07-29 ENCOUNTER — Ambulatory Visit (INDEPENDENT_AMBULATORY_CARE_PROVIDER_SITE_OTHER): Payer: PRIVATE HEALTH INSURANCE | Admitting: Physician Assistant

## 2015-07-29 ENCOUNTER — Ambulatory Visit (INDEPENDENT_AMBULATORY_CARE_PROVIDER_SITE_OTHER): Payer: PRIVATE HEALTH INSURANCE

## 2015-07-29 VITALS — BP 98/76 | HR 66 | Temp 98.0°F | Resp 16 | Ht 62.0 in | Wt 210.0 lb

## 2015-07-29 DIAGNOSIS — M79671 Pain in right foot: Secondary | ICD-10-CM

## 2015-07-29 DIAGNOSIS — S99922A Unspecified injury of left foot, initial encounter: Secondary | ICD-10-CM

## 2015-07-29 DIAGNOSIS — S99921A Unspecified injury of right foot, initial encounter: Secondary | ICD-10-CM | POA: Diagnosis not present

## 2015-07-29 MED ORDER — MELOXICAM 7.5 MG PO TABS: 7.5000 mg | ORAL_TABLET | Freq: Every day | ORAL | Status: DC

## 2015-07-29 NOTE — Progress Notes (Signed)
[facility] [address] [place] [address] [phone] [phone]  Date:  07/29/2015   Name:  Sherrise Liberto   DOB:  06-30-1970   MRN:  [id number]  PCP:  Herb Grays, MD    History of Present Illness:  Shannen Chumney is a 45 y.o. female patient who presents to [facility] for right heel pain.  1 month of treating with plantar fasciitis.  Advised to was walking today, and slid off the sidewalk and grass.  She felt immediate pain at her heal and heat.  She iced the foot.  No numbness or tingling.   Patient Active Problem List   Diagnosis Date Noted  . Localization-related (focal) (partial) epilepsy and epileptic syndromes with complex partial seizures, without mention of intractable epilepsy 01/04/2013  . Migraine without aura, without mention of intractable migraine without mention of status migrainosus 01/04/2013    Past Medical History  Diagnosis Date  . Seizures (HCC)   . Obesity   . GERD (gastroesophageal reflux disease)   . Localization-related (focal) (partial) epilepsy and epileptic syndromes with complex partial seizures, without mention of intractable epilepsy 01/04/2013  . Migraine without aura, without mention of intractable migraine without mention of status migrainosus 01/04/2013  . Memory deficit     Past Surgical History  Procedure Laterality Date  . Tonsillectomy      Social History  Substance Use Topics  . Smoking status: Never Smoker   . Smokeless tobacco: Never Used  . Alcohol Use: No    Family History  Problem Relation Age of Onset  . Migraines Father   . Myasthenia gravis Father   . Cancer Brother     Allergies  Allergen Reactions  . Keppra [Levetiracetam]   . Lamictal [Lamotrigine]   . Topamax [Topiramate]   . Azithromycin Other (See Comments)    Disoriented and out of breath    Medication list has been reviewed and updated.  Current Outpatient Prescriptions on File Prior to Visit  Medication Sig Dispense Refill  . carbamazepine  ([name]) 200 MG 12 hr capsule Take 1 capsule PO in the AM and 2 capsules PO in the PM 90 capsule 11  . carbamazepine ([name]) 300 MG 12 hr capsule Take 1 capsule (300 mg total) by mouth 2 (two) times daily. 60 capsule 11  . ibuprofen (ADVIL,MOTRIN) 600 MG tablet Take 1 tablet (600 mg total) by mouth every 8 (eight) hours as needed. 30 tablet 1  . rizatriptan (MAXALT) 10 MG tablet Take 1 tablet (10 mg total) by mouth 3 (three) times daily as needed for migraine. May repeat in 2 hours if needed 12 tablet 5   No current facility-administered medications on file prior to visit.    ROS ROS otherwise unremarkable unless listed above.   Physical Examination: BP 98/76 mmHg  Pulse 66  Temp(Src) 98 F (36.7 C) (Oral)  Resp 16  Ht  (1.575 m)  Wt 210 lb (95.255 kg)  BMI 38.40 kg/m2  SpO2 98%  LMP 07/22/2015 (Exact Date) Ideal Body Weight: Weight in (lb) to have BMI = 25: 136.4  Physical Exam  Constitutional: She is oriented to person, place, and time. She appears well-developed and well-nourished. No distress.  HENT:  Head: Normocephalic and atraumatic.  Right Ear: External ear normal.  Left Ear: External ear normal.  Eyes: Conjunctivae and EOM are normal. Pupils are equal, round, and reactive to light.  Cardiovascular: Normal rate.   Pulmonary/Chest: Effort normal. No respiratory distress.  Musculoskeletal:  Right ankle: Achilles tendon normal.  Tender along toe base of the heel.  No erythema or obvious swelling.  Pain with dorsiflexion however normal rom.     Neurological: She is alert and oriented to person, place, and time.  Skin: Skin is warm and dry. She is not diaphoretic.  Psychiatric: She has a normal mood and affect. Her behavior is normal.   Dg Os Calcis Right  07/29/2015  CLINICAL DATA:  History of plantar fascitis with pain following walking, initial encounter EXAM: RIGHT OS CALCIS - 2+ VIEW COMPARISON:  06/17/2015 FINDINGS: Small calcaneal spur is noted. No  acute fracture or dislocation is noted. No soft tissue abnormality is seen. IMPRESSION: No acute abnormality noted. Electronically Signed   By: Alcide CleverMark  Lukens M.D.   On: 07/29/2015 18:27    Assessment and Plan: Halaina [name] is a 45 y.o. female who is here today for right heel pain.   -advised rice, placed in compression bandage, and given heel cup.  Injury of heel, left, initial encounter - Plan: DG Os Calcis Right, meloxicam (MOBIC) 7.5 MG tablet  Heel pain, right - Plan: DG Os Calcis Right, meloxicam (MOBIC) 7.5 MG tablet  Trena PlattStephanie English, PA-C [facility] 3/15/20178:35 PM

## 2015-07-29 NOTE — Patient Instructions (Addendum)
IF you received an x-ray today, you will receive an invoice from [facility]. Please contact [facility] at [phone] with questions or concerns regarding your invoice.   IF you received labwork today, you will receive an invoice from [facility]. Please contact [facility] at [phone] with questions or concerns regarding your invoice.   Our billing staff will not be able to assist you with questions regarding bills from these companies.  You will be contacted with the lab results as soon as they are available. The fastest way to get your results is to activate your My Chart account. Instructions are located on the last page of this paperwork. If you have not heard from us regarding the results in 2 weeks, please contact this office.  Please ice the heel three times per day for 15-20 minutes.  I would like you to make sure that you are hydrating well.   Please do not take the mobic with ibuprofen or naproxen.  You are able to take tylenol if needed. Rest as much as possible and get it elevated as much as possible for the next 72 hours.  If you continue to have pain after 10 days, please return.

## 2015-08-19 ENCOUNTER — Ambulatory Visit (INDEPENDENT_AMBULATORY_CARE_PROVIDER_SITE_OTHER): Payer: PRIVATE HEALTH INSURANCE | Admitting: Podiatry

## 2015-08-19 ENCOUNTER — Encounter: Payer: Self-pay | Admitting: Podiatry

## 2015-08-19 DIAGNOSIS — M79671 Pain in right foot: Secondary | ICD-10-CM

## 2015-08-19 DIAGNOSIS — M722 Plantar fascial fibromatosis: Secondary | ICD-10-CM | POA: Diagnosis not present

## 2015-08-19 MED ORDER — TRIAMCINOLONE ACETONIDE 10 MG/ML IJ SUSP
10.0000 mg | Freq: Once | INTRAMUSCULAR | Status: AC
  Administered 2015-08-19: 10 mg

## 2015-09-09 ENCOUNTER — Ambulatory Visit (INDEPENDENT_AMBULATORY_CARE_PROVIDER_SITE_OTHER): Payer: PRIVATE HEALTH INSURANCE | Admitting: Podiatry

## 2015-09-09 DIAGNOSIS — M722 Plantar fascial fibromatosis: Secondary | ICD-10-CM | POA: Diagnosis not present

## 2015-09-09 NOTE — Patient Instructions (Signed)

## 2016-01-12 ENCOUNTER — Encounter: Payer: Self-pay | Admitting: Adult Health

## 2016-01-12 ENCOUNTER — Ambulatory Visit (INDEPENDENT_AMBULATORY_CARE_PROVIDER_SITE_OTHER): Payer: PRIVATE HEALTH INSURANCE | Admitting: Adult Health

## 2016-01-12 VITALS — BP 127/87 | HR 78 | Resp 20 | Ht 62.0 in | Wt 209.0 lb

## 2016-01-12 DIAGNOSIS — R569 Unspecified convulsions: Secondary | ICD-10-CM | POA: Diagnosis not present

## 2016-01-12 DIAGNOSIS — G43109 Migraine with aura, not intractable, without status migrainosus: Secondary | ICD-10-CM

## 2016-01-12 DIAGNOSIS — Z5181 Encounter for therapeutic drug level monitoring: Secondary | ICD-10-CM

## 2016-01-12 NOTE — Progress Notes (Signed)
PATIENT: Anna Pugh DOB: 11-15-1970  REASON FOR VISIT: follow up- seizures, migraine headaches HISTORY FROM: patient  HISTORY OF PRESENT ILLNESS: Anna Pugh is a 45 year old female with a history of seizures and migraine headaches. She returns today for follow-up. At the last visit [name] was increased. She is currently taking 500 mg in the morning and 700 mg in the evening. She states that since the increased she has not had any migraine headaches. She denies any seizure events. She states "this is the best she has felt." Denies any changes with her gait or balance. She returns today for an evaluation.  HISTORY 01/06/15: Anna Pugh is a 45 year old female with a history of seizures and migraine headaches. The patient has been on [name] taking 500 mg twice a day. She reports that this has been working well for her. However in the last week she has had 3 migraine headaches. She states that her first headache occurred last Tuesday while at work. She became lightheaded, confused and had a headache. She states she also had the sensation like her face is being pushed to the left. She states that her migraines have always been atypical however this migraine she had confusion where as before she has not. She states that the confusion lasted for approximately 45 minutes. Her coworkers states that she did not have any shaking or trembling in the extremities. She states then on Sunday she had a migraine headache she states that in addition to the headaches she had the sensation that someone was squeezing her right arm. She was able to take Maxalt and her symptoms resolved. She states that a couple hours later the headache returned and she had more of a numbness in the right arm. She states that after her migraines she is typically very sleepy. The patient states in the past when she's had a seizure it is usually precipitated by an aura that consists of a strobe light in the right eye. She will then feel  as if her face is being pushed to the left and she will have convulsing  in the extremities. She states that she typically cannot remember having the seizure. She states that in the past week she has been very stressed with her job. In the past she is also had increase in migraine headaches when she is under a lot of stress. She denies any new symptoms. She returns today for an evaluation.  HISTORY 01/03/14: Anna Pugh is a 45 year old left-handed white female with a history of seizures and migraine headaches. The patient has done quite well over the last one year, she indicates that she essentially has not had any migraine, and she has not had any seizures. The patient is on [name] taking 500 mg twice daily. She was placed on Inderal for her migraines, but she never took the medication. The patient has been exercising on a regular basis, and overall feels better. She denies any other new medical issues that have come up since last seen. Her father was recently diagnosed with myasthenia gravis  REVIEW OF SYSTEMS: Out of a complete 14 system review of symptoms, the patient complains only of the following symptoms, and all other reviewed systems are negative.  See history of present illness  ALLERGIES: Allergies  Allergen Reactions  . Keppra [Levetiracetam]   . Lamictal [Lamotrigine]   . Topamax [Topiramate]   . Azithromycin Other (See Comments)    Disoriented and out of breath    HOME MEDICATIONS: Outpatient Medications Prior to  Visit  Medication Sig Dispense Refill  . carbamazepine ([name]) 200 MG 12 hr capsule Take 1 capsule PO in the AM and 2 capsules PO in the PM 90 capsule 11  . carbamazepine ([name]) 300 MG 12 hr capsule Take 1 capsule (300 mg total) by mouth 2 (two) times daily. 60 capsule 11  . ibuprofen (ADVIL,MOTRIN) 600 MG tablet Take 1 tablet (600 mg total) by mouth every 8 (eight) hours as needed. 30 tablet 1  . meloxicam (MOBIC) 7.5 MG tablet Take 1 tablet (7.5 mg  total) by mouth daily. 30 tablet 0  . rizatriptan (MAXALT) 10 MG tablet Take 1 tablet (10 mg total) by mouth 3 (three) times daily as needed for migraine. May repeat in 2 hours if needed 12 tablet 5   No facility-administered medications prior to visit.     PAST MEDICAL HISTORY: Past Medical History:  Diagnosis Date  . GERD (gastroesophageal reflux disease)   . Localization-related (focal) (partial) epilepsy and epileptic syndromes with complex partial seizures, without mention of intractable epilepsy 01/04/2013  . Memory deficit   . Migraine without aura, without mention of intractable migraine without mention of status migrainosus 01/04/2013  . Obesity   . Seizures (HCC)     PAST SURGICAL HISTORY: Past Surgical History:  Procedure Laterality Date  . TONSILLECTOMY      FAMILY HISTORY: Family History  Problem Relation Age of Onset  . Migraines Father   . Myasthenia gravis Father   . Cancer Brother     SOCIAL HISTORY: Social History   Social History  . Marital status: Single    Spouse name: N/A  . Number of children: 0  . Years of education: Bachelor   Occupational History  .      Employed by [employer]   Social History Main Topics  . Smoking status: Never Smoker  . Smokeless tobacco: Never Used  . Alcohol use No  . Drug use: No  . Sexual activity: Not on file   Other Topics Concern  . Not on file   Social History Narrative   Patient is single with no children.   Patient is left handed.   Patient has a Bachelor's degree.   Patient drinks 3 cups daily.      PHYSICAL EXAM  Vitals:   01/12/16 0723  BP: 127/87  Pulse: 78  Resp: 20  Weight: 209 lb (94.8 kg)  Height: 5\' 2"  (1.575 m)   Body mass index is 38.23 kg/m.  Generalized: Well developed, in no acute distress   Neurological examination  Mentation: Alert oriented to time, place, history taking. Follows all commands speech and language fluent Cranial nerve II-XII: Pupils were equal round  reactive to light. Extraocular movements were full, visual field were full on confrontational test. Facial sensation and strength were normal. Uvula tongue midline. Head turning and shoulder shrug  were normal and symmetric. Motor: The motor testing reveals 5 over 5 strength of all 4 extremities. Good symmetric motor tone is noted throughout.  Sensory: Sensory testing is intact to soft touch on all 4 extremities. No evidence of extinction is noted.  Coordination: Cerebellar testing reveals good finger-nose-finger and heel-to-shin bilaterally.  Gait and station: Gait is normal. Tandem gait is normal. Romberg is negative. No drift is seen.  Reflexes: Deep tendon reflexes are symmetric and normal bilaterally.   DIAGNOSTIC DATA (LABS, IMAGING, TESTING) - I reviewed patient records, labs, notes, testing and imaging myself where available.  Lab Results  Component Value Date  WBC 8.0 01/06/2015   HGB 12.6 01/03/2014   HCT 38.8 01/06/2015   MCV 88 01/06/2015   PLT 373 01/06/2015      Component Value Date/Time   NA 138 01/06/2015 1702   K 4.0 01/06/2015 1702   CL 98 01/06/2015 1702   CO2 20 01/06/2015 1702   GLUCOSE 91 01/06/2015 1702   BUN 10 01/06/2015 1702   CREATININE 0.70 01/06/2015 1702   CALCIUM 9.3 01/06/2015 1702   PROT 7.5 01/06/2015 1702   ALBUMIN 4.3 01/06/2015 1702   AST 20 01/06/2015 1702   ALT 24 01/06/2015 1702   ALKPHOS 100 01/06/2015 1702   BILITOT <0.2 01/06/2015 1702   GFRNONAA 106 01/06/2015 1702   GFRAA 122 01/06/2015 1702       ASSESSMENT AND PLAN 45 y.o. year old female  has a past medical history of GERD (gastroesophageal reflux disease); Localization-related (focal) (partial) epilepsy and epileptic syndromes with complex partial seizures, without mention of intractable epilepsy (01/04/2013); Memory deficit; Migraine without aura, without mention of intractable migraine without mention of status migrainosus (01/04/2013); Obesity; and Seizures (HCC). here  with:  1. Migraines 2. Seizures  Overall the patient is doing well. She will continue on [name] 500 mg in the morning and 700 mg in the evening. I will check blood work today. Patient advised that if her symptoms worsen or she develops any new symptoms she should let us. Follow-up in 6 months or sooner if needed.     Butch Penny, MSN, NP-C 01/12/2016, 8:25 AM [facility] [address], [address] [place], [place] [address] [phone]

## 2016-01-12 NOTE — Progress Notes (Signed)
I have read the note, and I agree with the clinical assessment and plan.  Zayneb Baucum KEITH   

## 2016-01-12 NOTE — Patient Instructions (Signed)
Continue Carbamazapine Blood work today If your symptoms worsen or you develop new symptoms please let us know.

## 2016-01-13 LAB — CBC WITH DIFFERENTIAL/PLATELET
BASOS: 1 %
Basophils Absolute: 0 10*3/uL (ref 0.0–0.2)
EOS (ABSOLUTE): 0.2 10*3/uL (ref 0.0–0.4)
EOS: 3 %
HEMATOCRIT: 38.6 % (ref 34.0–46.6)
Hemoglobin: 12.7 g/dL (ref 11.1–15.9)
IMMATURE GRANS (ABS): 0 10*3/uL (ref 0.0–0.1)
IMMATURE GRANULOCYTES: 0 %
LYMPHS: 22 %
Lymphocytes Absolute: 1.4 10*3/uL (ref 0.7–3.1)
MCH: 28.4 pg (ref 26.6–33.0)
MCHC: 32.9 g/dL (ref 31.5–35.7)
MCV: 86 fL (ref 79–97)
Monocytes Absolute: 0.8 10*3/uL (ref 0.1–0.9)
Monocytes: 13 %
NEUTROS ABS: 3.9 10*3/uL (ref 1.4–7.0)
NEUTROS PCT: 61 %
Platelets: 322 10*3/uL (ref 150–379)
RBC: 4.47 x10E6/uL (ref 3.77–5.28)
RDW: 14.5 % (ref 12.3–15.4)
WBC: 6.3 10*3/uL (ref 3.4–10.8)

## 2016-01-13 LAB — COMPREHENSIVE METABOLIC PANEL
A/G RATIO: 1.4 (ref 1.2–2.2)
ALBUMIN: 4.1 g/dL (ref 3.5–5.5)
ALT: 18 IU/L (ref 0–32)
AST: 20 IU/L (ref 0–40)
Alkaline Phosphatase: 102 IU/L (ref 39–117)
BUN / CREAT RATIO: 25 — AB (ref 9–23)
BUN: 18 mg/dL (ref 6–24)
Bilirubin Total: <0.2 mg/dL (ref 0.0–1.2)
CALCIUM: 9.1 mg/dL (ref 8.7–10.2)
CO2: 22 mmol/L (ref 18–29)
CREATININE: 0.72 mg/dL (ref 0.57–1.00)
Chloride: 102 mmol/L (ref 96–106)
GFR, EST AFRICAN AMERICAN: 117 mL/min/{1.73_m2} (ref >59)
GFR, EST NON AFRICAN AMERICAN: 101 mL/min/{1.73_m2} (ref >59)
GLOBULIN, TOTAL: 3 g/dL (ref 1.5–4.5)
Glucose: 80 mg/dL (ref 65–99)
POTASSIUM: 4.2 mmol/L (ref 3.5–5.2)
SODIUM: 138 mmol/L (ref 134–144)
TOTAL PROTEIN: 7.1 g/dL (ref 6.0–8.5)

## 2016-01-13 LAB — CARBAMAZEPINE LEVEL, TOTAL: Carbamazepine (Tegretol), S: 14.3 ug/mL (ref 4.0–12.0)

## 2016-01-14 ENCOUNTER — Telehealth: Payer: Self-pay | Admitting: Adult Health

## 2016-01-14 ENCOUNTER — Other Ambulatory Visit: Payer: Self-pay | Admitting: Adult Health

## 2016-01-14 MED ORDER — CARBAMAZEPINE ER 100 MG PO CP12: ORAL_CAPSULE | ORAL | 5 refills | Status: DC

## 2016-01-14 NOTE — Telephone Encounter (Addendum)
I called the patient. Her carbamazepine level was increased. She will decrease this slightly. She will reduce to 500 mg in the morning and 600 mg in the evening. I have called her in the 100 mg tablet. We will recheck blood work in one month.

## 2016-01-14 NOTE — Addendum Note (Signed)
Addended by: Enedina FinnerMILLIKAN, Hayzel Ruberg P on: 01/14/2016 03:32 PM   Modules accepted: Orders

## 2016-01-14 NOTE — Telephone Encounter (Signed)
Patient returned Megan's call. Please call [phone] after 3pm.

## 2016-01-14 NOTE — Telephone Encounter (Signed)
I called the patient and left a message. Instructed patient to call our office.

## 2016-01-23 ENCOUNTER — Other Ambulatory Visit: Payer: Self-pay | Admitting: Adult Health

## 2016-02-12 NOTE — Progress Notes (Signed)
Subjective:     Patient ID: Anna Pugh, female   DOB: 11/22/1970, 45 y.o.   MRN: [id number]  HPI patient states she's had recurrence of heel pain right with inflammation fluid medial   Review of Systems     Objective:   Physical Exam Neurovascular status intact with inflamed medial band right plantar fascia    Assessment:     Plantar fasciitis with inflammation right    Plan:     Injected right plantar fascia 3 Milligan Kenalog 5 mill grams Xylocaine and instructed on physical therapy and shoe gear modifications

## 2016-02-18 ENCOUNTER — Telehealth: Payer: Self-pay | Admitting: Adult Health

## 2016-02-18 ENCOUNTER — Telehealth: Payer: Self-pay | Admitting: *Deleted

## 2016-02-18 DIAGNOSIS — Z5181 Encounter for therapeutic drug level monitoring: Secondary | ICD-10-CM

## 2016-02-18 NOTE — Telephone Encounter (Signed)
Please call patient and have her come back in for carbamazepine level.

## 2016-02-18 NOTE — Telephone Encounter (Signed)
Called and LVM for pt to come for repeat labs for carbamazepine level per MM, NP request. Gave lab hours and [facility] phone number if she has further questions.

## 2016-02-18 NOTE — Telephone Encounter (Signed)
Called and LVM for pt to come for repeat labs for carbamazepine level per MM, NP request. Gave lab hours and [facility] phone number if she has further questions.  

## 2016-02-18 NOTE — Telephone Encounter (Signed)
-----   Message from Butch PennyMegan Eilish Mcdaniel, NP sent at 01/14/2016  4:05 PM EDT ----- Carbamazepine level

## 2016-02-22 NOTE — Telephone Encounter (Signed)
Called patient back to ensure she received message. She stated she forgot to come for lab work. She will come in tomorrow morning to have it completed. Advised I will let MM,NP know. She verbalized understanding.

## 2016-02-25 ENCOUNTER — Other Ambulatory Visit (INDEPENDENT_AMBULATORY_CARE_PROVIDER_SITE_OTHER): Payer: Self-pay

## 2016-02-25 DIAGNOSIS — Z5181 Encounter for therapeutic drug level monitoring: Secondary | ICD-10-CM

## 2016-02-25 DIAGNOSIS — Z0289 Encounter for other administrative examinations: Secondary | ICD-10-CM

## 2016-02-26 LAB — CARBAMAZEPINE LEVEL, TOTAL: Carbamazepine (Tegretol), S: 10.4 ug/mL (ref 4.0–12.0)

## 2016-02-29 ENCOUNTER — Telehealth: Payer: Self-pay | Admitting: *Deleted

## 2016-02-29 NOTE — Telephone Encounter (Signed)
-----   Message from Butch PennyMegan Millikan, NP sent at 02/29/2016  8:23 AM EDT ----- Carbamazepine level in normal range. Please call patient

## 2016-02-29 NOTE — Telephone Encounter (Signed)
LMVM (mobile-ok per DPR) that carbamazepine level was in normal range.  She is to call back if questions.

## 2016-07-16 ENCOUNTER — Other Ambulatory Visit: Payer: Self-pay | Admitting: Adult Health

## 2016-10-06 ENCOUNTER — Encounter: Payer: Self-pay | Admitting: Podiatry

## 2016-10-06 ENCOUNTER — Ambulatory Visit (INDEPENDENT_AMBULATORY_CARE_PROVIDER_SITE_OTHER): Payer: PRIVATE HEALTH INSURANCE | Admitting: Podiatry

## 2016-10-06 DIAGNOSIS — M722 Plantar fascial fibromatosis: Secondary | ICD-10-CM | POA: Diagnosis not present

## 2016-10-06 MED ORDER — TRIAMCINOLONE ACETONIDE 10 MG/ML IJ SUSP
10.0000 mg | Freq: Once | INTRAMUSCULAR | Status: AC
  Administered 2016-10-06: 10 mg

## 2016-10-08 NOTE — Progress Notes (Signed)
Subjective:    Patient ID: Anna Pugh, female   DOB: 46 y.o.   MRN: [id number]   HPI patient presents stating she's been having a lot of pain in her left heel and it has gotten gradually worse    ROS      Objective:  Physical Exam Neurovascular status intact negative Homans sign noted with patient found to have old orthotics which lost some of their support and is found to have discomfort plantar heel at the insertional point tendon calcaneus    Assessment:   Inflammatory fasciitis with moderate discomfort and depression of the arch      Plan:    H&P condition reviewed and recommended long-term change and orthotics with a new pair made and I went ahead and injected the plantar fascial left 3 mg Kenalog 5 mg Xylocaine and applied fascial brace to reduce pressure against the

## 2016-10-21 ENCOUNTER — Encounter: Payer: Self-pay | Admitting: Podiatry

## 2016-10-21 ENCOUNTER — Ambulatory Visit (INDEPENDENT_AMBULATORY_CARE_PROVIDER_SITE_OTHER): Payer: PRIVATE HEALTH INSURANCE | Admitting: Podiatry

## 2016-10-21 DIAGNOSIS — M722 Plantar fascial fibromatosis: Secondary | ICD-10-CM

## 2016-10-21 NOTE — Progress Notes (Signed)
Subjective:    Patient ID: Anna Pugh, female   DOB: 46 y.o.   MRN: [id number]   HPI patient presents stating she's doing pretty well but needs a new pair of orthotics    ROS      Objective:  Physical Exam neurovascular status intact negative Homans sign was noted with patient's left heel improved quite a bit with mild discomfort upon deep palpation     Assessment:    Plantar fasciitis improved but still present left     Plan:    Advised on physical therapy anti-inflammatories and went ahead today and scanned for customized orthotic devices for long-term as she is done well with them in the past

## 2016-11-07 ENCOUNTER — Encounter: Payer: Self-pay | Admitting: Neurology

## 2016-11-10 ENCOUNTER — Telehealth: Payer: Self-pay | Admitting: Podiatry

## 2016-11-10 NOTE — Telephone Encounter (Signed)
Left message for pt to call to schedule an appt with Raiford Nobleick to pick up orthotics

## 2016-12-16 ENCOUNTER — Other Ambulatory Visit: Payer: Self-pay | Admitting: Adult Health

## 2016-12-23 ENCOUNTER — Encounter: Payer: Self-pay | Admitting: Physician Assistant

## 2016-12-23 ENCOUNTER — Ambulatory Visit (INDEPENDENT_AMBULATORY_CARE_PROVIDER_SITE_OTHER): Payer: 59 | Admitting: Physician Assistant

## 2016-12-23 VITALS — BP 110/79 | HR 74 | Temp 98.4°F | Resp 16 | Ht 62.0 in | Wt 213.0 lb

## 2016-12-23 DIAGNOSIS — R350 Frequency of micturition: Secondary | ICD-10-CM

## 2016-12-23 DIAGNOSIS — N3000 Acute cystitis without hematuria: Secondary | ICD-10-CM

## 2016-12-23 DIAGNOSIS — R1030 Lower abdominal pain, unspecified: Secondary | ICD-10-CM

## 2016-12-23 LAB — POCT URINALYSIS DIP (MANUAL ENTRY)
BILIRUBIN UA: NEGATIVE mg/dL
Bilirubin, UA: NEGATIVE
Blood, UA: NEGATIVE
Glucose, UA: NEGATIVE mg/dL
Nitrite, UA: POSITIVE — AB
PH UA: 5.5 (ref 5.0–8.0)
Protein Ur, POC: NEGATIVE mg/dL
SPEC GRAV UA: 1.01 (ref 1.010–1.025)
Urobilinogen, UA: 0.2 E.U./dL

## 2016-12-23 MED ORDER — NITROFURANTOIN MONOHYD MACRO 100 MG PO CAPS: 100.0000 mg | ORAL_CAPSULE | Freq: Two times a day (BID) | ORAL | 0 refills | Status: AC

## 2016-12-23 NOTE — Patient Instructions (Addendum)
Continue to hydrate well with 64 oz of water  Urinary Tract Infection, Adult A urinary tract infection (UTI) is an infection of any part of the urinary tract. The urinary tract includes the:  Kidneys.  Ureters.  Bladder.  Urethra.  These organs make, store, and get rid of pee (urine) in the body. Follow these instructions at home:  Take over-the-counter and prescription medicines only as told by your doctor.  If you were prescribed an antibiotic medicine, take it as told by your doctor. Do not stop taking the antibiotic even if you start to feel better.  Avoid the following drinks: ? Alcohol. ? Caffeine. ? Tea. ? Carbonated drinks.  Drink enough fluid to keep your pee clear or pale yellow.  Keep all follow-up visits as told by your doctor. This is important.  Make sure to: ? Empty your bladder often and completely. Do not to hold pee for long periods of time. ? Empty your bladder before and after sex. ? Wipe from front to back after a bowel movement if you are female. Use each tissue one time when you wipe. Contact a doctor if:  You have back pain.  You have a fever.  You feel sick to your stomach (nauseous).  You throw up (vomit).  Your symptoms do not get better after 3 days.  Your symptoms go away and then come back. Get help right away if:  You have very bad back pain.  You have very bad lower belly (abdominal) pain.  You are throwing up and cannot keep down any medicines or water. This information is not intended to replace advice given to you by your health care provider. Make sure you discuss any questions you have with your health care provider. Document Released: 10/19/2007 Document Revised: 10/08/2015 Document Reviewed: 03/23/2015 Elsevier Interactive Patient Education  2018 [employer]      IF you received an x-ray today, you will receive an invoice from [facility]. Please contact [facility] at [phone] with questions  or concerns regarding your invoice.   IF you received labwork today, you will receive an invoice from [facility]. Please contact [facility] at [phone] with questions or concerns regarding your invoice.   Our billing staff will not be able to assist you with questions regarding bills from these companies.  You will be contacted with the lab results as soon as they are available. The fastest way to get your results is to activate your My Chart account. Instructions are located on the last page of this paperwork. If you have not heard from us regarding the results in 2 weeks, please contact this office.

## 2016-12-23 NOTE — Progress Notes (Signed)
[facility] [address], [place] [phone]  Date:  12/23/2016   Name:  Anna Pugh   DOB:  08/16/1970   MRN:  [id number]  PCP:  Herb GraysSpear, Tammy, MD (Inactive)    History of Present Illness:  Anna Pugh is a [age] y.o. female patient who presents to PCP with  Chief Complaint  Patient presents with  . Urinary Frequency    x 3 days  . Dysuria    x 3 days     3 days of worsening discomfort with urination.  Suprapubic pain, and slight dysuria.  She has increased her water intake, but no frequency outside of that No nausea, fever, or abnormal vaginal discharge.  No back pain.    Patient Active Problem List   Diagnosis Date Noted  . Localization-related (focal) (partial) epilepsy and epileptic syndromes with complex partial seizures, without mention of intractable epilepsy 01/04/2013  . Migraine without aura, without mention of intractable migraine without mention of status migrainosus 01/04/2013    Past Medical History:  Diagnosis Date  . GERD (gastroesophageal reflux disease)   . Localization-related (focal) (partial) epilepsy and epileptic syndromes with complex partial seizures, without mention of intractable epilepsy 01/04/2013  . Memory deficit   . Migraine without aura, without mention of intractable migraine without mention of status migrainosus 01/04/2013  . Obesity   . Seizures (HCC)     Past Surgical History:  Procedure Laterality Date  . TONSILLECTOMY      Social History  Substance Use Topics  . Smoking status: Never Smoker  . Smokeless tobacco: Never Used  . Alcohol use No    Family History  Problem Relation Age of Onset  . Migraines Father   . Myasthenia gravis Father   . Cancer Brother     Allergies  Allergen Reactions  . Keppra [Levetiracetam]   . Lamictal [Lamotrigine]   . Topamax [Topiramate]   . Azithromycin Other (See Comments)    Disoriented and out of breath    Medication list has been reviewed and  updated.  Current Outpatient Prescriptions on File Prior to Visit  Medication Sig Dispense Refill  . carbamazepine ([name]) 100 MG 12 hr capsule TAKE 2 CAPSULES BY MOUTH IN THE MORNING 60 capsule 1  . ibuprofen (ADVIL,MOTRIN) 600 MG tablet Take 1 tablet (600 mg total) by mouth every 8 (eight) hours as needed. 30 tablet 1  . rizatriptan (MAXALT) 10 MG tablet Take 1 tablet (10 mg total) by mouth 3 (three) times daily as needed for migraine. May repeat in 2 hours if needed 12 tablet 5   No current facility-administered medications on file prior to visit.     ROS ROS otherwise unremarkable unless listed above.  Physical Examination: BP 110/79   Pulse 74   Temp 98.4 F (36.9 C) (Oral)   Resp 16   Ht 5\' 2"  (1.575 m)   Wt 213 lb (96.6 kg)   LMP 11/24/2016   SpO2 100%   BMI 38.96 kg/m  Ideal Body Weight: Weight in (lb) to have BMI = 25: 136.4  Physical Exam  Constitutional: She is oriented to person, place, and time. She appears well-developed and well-nourished. No distress.  HENT:  Head: Normocephalic and atraumatic.  Right Ear: External ear normal.  Left Ear: External ear normal.  Eyes: Pupils are equal, round, and reactive to light. Conjunctivae and EOM are normal.  Cardiovascular: Normal rate.   Pulmonary/Chest: Effort normal. No respiratory distress.  Abdominal: Soft. Normal appearance and  bowel sounds are normal. There is tenderness in the suprapubic area.  Neurological: She is alert and oriented to person, place, and time.  Skin: She is not diaphoretic.  Psychiatric: She has a normal mood and affect. Her behavior is normal.   Results for orders placed or performed in visit on 12/23/16  POCT urinalysis dipstick  Result Value Ref Range   Color, UA yellow yellow   Clarity, UA clear clear   Glucose, UA negative negative mg/dL   Bilirubin, UA negative negative   Ketones, POC UA negative negative mg/dL   Spec Grav, UA 1.610 9.604 - 1.025   Blood, UA negative negative    pH, UA 5.5 5.0 - 8.0   Protein Ur, POC negative negative mg/dL   Urobilinogen, UA 0.2 0.2 or 1.0 E.U./dL   Nitrite, UA Positive (A) Negative   Leukocytes, UA Small (1+) (A) Negative      Assessment and Plan: Toshie Herbst is a 46 y.o. female who is here today for urinary discomfort. Return if symptoms worsen or fail to improve.  Urinary frequency - Plan: POCT urinalysis dipstick, Urine Culture  Acute cystitis without hematuria - Plan: nitrofurantoin, macrocrystal-monohydrate, (MACROBID) 100 MG capsule  Lower abdominal pain - Plan: POCT urinalysis dipstick, Urine Culture  Trena Platt, PA-C [facility] 8/11/20188:00 AM

## 2016-12-28 ENCOUNTER — Other Ambulatory Visit: Payer: Self-pay | Admitting: Physician Assistant

## 2016-12-28 DIAGNOSIS — N3000 Acute cystitis without hematuria: Secondary | ICD-10-CM

## 2016-12-28 LAB — URINE CULTURE

## 2016-12-28 MED ORDER — CEPHALEXIN 500 MG PO CAPS: 500.0000 mg | ORAL_CAPSULE | Freq: Two times a day (BID) | ORAL | 0 refills | Status: AC

## 2017-01-11 ENCOUNTER — Ambulatory Visit: Payer: PRIVATE HEALTH INSURANCE | Admitting: Neurology

## 2017-01-19 ENCOUNTER — Encounter: Payer: Self-pay | Admitting: Neurology

## 2017-01-19 ENCOUNTER — Ambulatory Visit (INDEPENDENT_AMBULATORY_CARE_PROVIDER_SITE_OTHER): Payer: 59 | Admitting: Neurology

## 2017-01-19 VITALS — BP 124/86 | HR 76 | Ht 62.0 in | Wt 212.0 lb

## 2017-01-19 DIAGNOSIS — Z5181 Encounter for therapeutic drug level monitoring: Secondary | ICD-10-CM

## 2017-01-19 DIAGNOSIS — G43009 Migraine without aura, not intractable, without status migrainosus: Secondary | ICD-10-CM

## 2017-01-19 DIAGNOSIS — G40209 Localization-related (focal) (partial) symptomatic epilepsy and epileptic syndromes with complex partial seizures, not intractable, without status epilepticus: Secondary | ICD-10-CM

## 2017-01-19 MED ORDER — RIZATRIPTAN BENZOATE 10 MG PO TABS: 10.0000 mg | ORAL_TABLET | Freq: Three times a day (TID) | ORAL | 3 refills | Status: DC | PRN

## 2017-01-19 MED ORDER — CARBAMAZEPINE ER 300 MG PO CP12: ORAL_CAPSULE | ORAL | 3 refills | Status: DC

## 2017-01-19 MED ORDER — CARBAMAZEPINE ER 100 MG PO CP12: ORAL_CAPSULE | ORAL | 3 refills | Status: DC

## 2017-01-19 NOTE — Progress Notes (Signed)
Reason for visit: Seizures  Anna Pugh is an 46 y.o. female  History of present illness:  Anna Pugh is a 46 year old left-handed white female with a history of seizures and migraine headaches. The patient has done quite well with both issues, she has not had any migraine headaches since last seen one year ago, she has not had any recurring seizures. She is on maximum doses of [name], she is tolerating the medication well. She denies drowsiness or gait instability. She is not on vitamin D supplementation. She returns for routine reevaluation. No other significant medical issues have come up since last seen.  Past Medical History:  Diagnosis Date  . GERD (gastroesophageal reflux disease)   . Localization-related (focal) (partial) epilepsy and epileptic syndromes with complex partial seizures, without mention of intractable epilepsy 01/04/2013  . Memory deficit   . Migraine without aura, without mention of intractable migraine without mention of status migrainosus 01/04/2013  . Obesity   . Seizures (HCC)     Past Surgical History:  Procedure Laterality Date  . TONSILLECTOMY      Family History  Problem Relation Age of Onset  . Migraines Father   . Myasthenia gravis Father   . Cancer Brother     Social history:  reports that she has never smoked. She has never used smokeless tobacco. She reports that she does not drink alcohol or use drugs.    Allergies  Allergen Reactions  . Keppra [Levetiracetam]   . Lamictal [Lamotrigine]   . Topamax [Topiramate]   . Azithromycin Other (See Comments)    Disoriented and out of breath    Medications:  Prior to Admission medications   Medication Sig Start Date End Date Taking? Authorizing Provider  carbamazepine ([name]) 100 MG 12 hr capsule TAKE 2 CAPSULES BY MOUTH IN THE MORNING 12/19/16  Yes York SpanielWillis, Latisia Hilaire K, MD  carbamazepine ([name]) 300 MG 12 hr capsule Take 300-600 mg by mouth 2 (two) times daily. 300 mg in am and 600  mg in pm 10/14/16  Yes [provider]  ibuprofen (ADVIL,MOTRIN) 600 MG tablet Take 1 tablet (600 mg total) by mouth every 8 (eight) hours as needed. 06/03/15  Yes Ofilia Neaslark, Michael L, PA-C  rizatriptan (MAXALT) 10 MG tablet Take 1 tablet (10 mg total) by mouth 3 (three) times daily as needed for migraine. May repeat in 2 hours if needed 01/12/15  Yes York SpanielWillis, Samarrah Tranchina K, MD    ROS:  Out of a complete 14 system review of symptoms, the patient complains only of the following symptoms, and all other reviewed systems are negative.  Seizures  Blood pressure 124/86, pulse 76, height 5\' 2"  (1.575 m), weight 212 lb (96.2 kg).  Physical Exam  General: The patient is alert and cooperative at the time of the examination. The patient is moderately obese.   Skin: No significant peripheral edema is noted.   Neurologic Exam  Mental status: The patient is alert and oriented x 3 at the time of the examination. The patient has apparent normal recent and remote memory, with an apparently normal attention span and concentration ability.   Cranial nerves: Facial symmetry is present. Speech is normal, no aphasia or dysarthria is noted. Extraocular movements are full. Visual fields are full.  Motor: The patient has good strength in all 4 extremities.  Sensory examination: Soft touch sensation is symmetric on the face, arms, and legs.  Coordination: The patient has good finger-nose-finger and heel-to-shin bilaterally.  Gait and station: The patient has  a normal gait. Tandem gait is normal. Romberg is negative. No drift is seen.  Reflexes: Deep tendon reflexes are symmetric.   Assessment/Plan:  1. History of seizures   2. Migraine headache   The patient is doing quite well at the current time. The patient will continue the [name] at the current doses, a prescription was called in for this medication. A prescription was called in for Maxalt to take if needed. She will have blood work done  today, she will follow-up in one year, sooner if needed.    Marlan Palau MD 01/19/2017 7:26 AM  [facility] [address] [place], [place] [address]  Phone [phone] Fax [phone]

## 2017-01-20 ENCOUNTER — Telehealth: Payer: Self-pay | Admitting: *Deleted

## 2017-01-20 LAB — CBC WITH DIFFERENTIAL/PLATELET
BASOS ABS: 0 10*3/uL (ref 0.0–0.2)
BASOS: 0 %
EOS (ABSOLUTE): 0.1 10*3/uL (ref 0.0–0.4)
Eos: 2 %
Hematocrit: 39.2 % (ref 34.0–46.6)
Hemoglobin: 12.9 g/dL (ref 11.1–15.9)
IMMATURE GRANULOCYTES: 0 %
Immature Grans (Abs): 0 10*3/uL (ref 0.0–0.1)
LYMPHS: 23 %
Lymphocytes Absolute: 1.4 10*3/uL (ref 0.7–3.1)
MCH: 28.6 pg (ref 26.6–33.0)
MCHC: 32.9 g/dL (ref 31.5–35.7)
MCV: 87 fL (ref 79–97)
MONOCYTES: 13 %
Monocytes Absolute: 0.8 10*3/uL (ref 0.1–0.9)
NEUTROS PCT: 62 %
Neutrophils Absolute: 3.8 10*3/uL (ref 1.4–7.0)
PLATELETS: 342 10*3/uL (ref 150–379)
RBC: 4.51 x10E6/uL (ref 3.77–5.28)
RDW: 14.6 % (ref 12.3–15.4)
WBC: 6.2 10*3/uL (ref 3.4–10.8)

## 2017-01-20 LAB — COMPREHENSIVE METABOLIC PANEL
A/G RATIO: 1.4 (ref 1.2–2.2)
ALT: 20 IU/L (ref 0–32)
AST: 24 IU/L (ref 0–40)
Albumin: 4.3 g/dL (ref 3.5–5.5)
Alkaline Phosphatase: 93 IU/L (ref 39–117)
BUN/Creatinine Ratio: 18 (ref 9–23)
BUN: 13 mg/dL (ref 6–24)
Bilirubin Total: <0.2 mg/dL (ref 0.0–1.2)
CALCIUM: 9.2 mg/dL (ref 8.7–10.2)
CO2: 23 mmol/L (ref 20–29)
Chloride: 101 mmol/L (ref 96–106)
Creatinine, Ser: 0.71 mg/dL (ref 0.57–1.00)
GFR calc Af Amer: 117 mL/min/{1.73_m2} (ref >59)
GFR, EST NON AFRICAN AMERICAN: 102 mL/min/{1.73_m2} (ref >59)
GLUCOSE: 82 mg/dL (ref 65–99)
Globulin, Total: 3 g/dL (ref 1.5–4.5)
POTASSIUM: 4.2 mmol/L (ref 3.5–5.2)
Sodium: 140 mmol/L (ref 134–144)
Total Protein: 7.3 g/dL (ref 6.0–8.5)

## 2017-01-20 LAB — CARBAMAZEPINE LEVEL, TOTAL: CARBAMAZEPINE LVL: 11.4 ug/mL (ref 4.0–12.0)

## 2017-01-20 NOTE — Telephone Encounter (Signed)
Called and LVM for pt relaying results per CW,MD note. Gave [facility] phone number if she has further questions.

## 2017-01-20 NOTE — Telephone Encounter (Signed)
-----   Message from York Spanielharles K Willis, MD sent at 01/20/2017  7:49 AM EDT -----  Blood work is unremarkable, carbamazepine is in therapeutic range, no change in dosing. Please call the patient. ----- Message ----- From: Nell RangeInterface, [facility] Lab Results In Sent: 01/20/2017   7:44 AM To: York Spanielharles K Willis, MD

## 2017-02-10 ENCOUNTER — Ambulatory Visit (INDEPENDENT_AMBULATORY_CARE_PROVIDER_SITE_OTHER): Payer: 59 | Admitting: Podiatry

## 2017-02-10 ENCOUNTER — Encounter: Payer: Self-pay | Admitting: Podiatry

## 2017-02-10 DIAGNOSIS — M722 Plantar fascial fibromatosis: Secondary | ICD-10-CM | POA: Diagnosis not present

## 2017-02-10 MED ORDER — TRIAMCINOLONE ACETONIDE 10 MG/ML IJ SUSP
10.0000 mg | Freq: Once | INTRAMUSCULAR | Status: AC
  Administered 2017-02-10: 10 mg

## 2017-02-10 NOTE — Patient Instructions (Addendum)
WEARING INSTRUCTIONS FOR ORTHOTICS  Don't expect to be comfortable wearing your orthotic devices for the first time.  Like eyeglasses, you may be aware of them as time passes, they will not be uncomfortable and you will enjoy wearing them.  FOLLOW THESE INSTRUCTIONS EXACTLY!  1. Wear your orthotic devices for:       Not more than 1 hour the first day.       Not more than 2 hours the second day.       Not more than 3 hours the third day and so on.        Or wear them for as long as they feel comfortable.       If you experience discomfort in your feet or legs take them out.  When feet & legs feel       better, put them back in.  You do need to be consistent and wear them a little        everyday. 2.   If at any time the orthotic devices become acutely uncomfortable before the       time for that particular day, STOP WEARING THEM. 3.   On the next day, do not increase the wearing time. 4.   Subsequently, increase the wearing time by 15-30 minutes only if comfortable to do       so. 5.   You will be seen by your doctor about 2-4 weeks after you receive your orthotic       devices, at which time you will probably be wearing your devices comfortably        for about 8 hours or more a day. 6.   Some patients occasionally report mild aches or discomfort in other parts of the of       body such as the knees, hips or back after 3 or 4 consecutive hours of wear.  If this       is the case with you, do not extend your wearing time.  Instead, cut it back an hour or       two.  In all likelihood, these symptoms will disappear in a short period of time as your       body posture realigns itself and functions more efficiently. 7.   It is possible that your orthotic device may require some small changes or adjustment       to improve their function or make them more comfortable.   This is usually not done       before one to three months have elapsed.  These adjustments are made in        accordance  with the changed position your feet are assuming as a result of       improved biomechanical function. 8.   In women's shoes, it's not unusual for your heel to slip out of the shoe, particularly if       they are step-in-shoes.  If this is the case, try other shoes or other styles.  Try to       purchase shoes which have deeper heal seats or higher heel counters. 9.   Squeaking of orthotics devices in the shoes is due to the movement of the devices       when they are functioning normally.  To eliminate squeaking, simply dust some       baby powder into your shoes before inserting the devices.  If this does not work,          apply soap or wax to the edges of the orthotic devices or put a tissue into the shoes. 10. It is important that you follow these directions explicitly.  Failure to do so will simply       prolong the adjustment period or create problems which are easily avoided.  It makes       no difference if you are wearing your orthotic devices for only a few hours after        several months, so long as you are wearing them comfortably for those hours. 11. If you have any questions or complaints, contact our office.  We have no way of       knowing about your problems unless you tell us.  If we do not hear from you, we will       assume that you are proceeding well.  Plantar Fasciitis (Heel Spur Syndrome) with Rehab The plantar fascia is a fibrous, ligament-like, soft-tissue structure that spans the bottom of the foot. Plantar fasciitis is a condition that causes pain in the foot due to inflammation of the tissue. SYMPTOMS   Pain and tenderness on the underneath side of the foot.  Pain that worsens with standing or walking. CAUSES  Plantar fasciitis is caused by irritation and injury to the plantar fascia on the underneath side of the foot. Common mechanisms of injury include:  Direct trauma to bottom of the foot.  Damage to a small nerve that runs under the foot where the main  fascia attaches to the heel bone.  Stress placed on the plantar fascia due to bone spurs. RISK INCREASES WITH:   Activities that place stress on the plantar fascia (running, jumping, pivoting, or cutting).  Poor strength and flexibility.  Improperly fitted shoes.  Tight calf muscles.  Flat feet.  Failure to warm-up properly before activity.  Obesity. PREVENTION  Warm up and stretch properly before activity.  Allow for adequate recovery between workouts.  Maintain physical fitness:  Strength, flexibility, and endurance.  Cardiovascular fitness.  Maintain a health body weight.  Avoid stress on the plantar fascia.  Wear properly fitted shoes, including arch supports for individuals who have flat feet.  PROGNOSIS  If treated properly, then the symptoms of plantar fasciitis usually resolve without surgery. However, occasionally surgery is necessary.  RELATED COMPLICATIONS   Recurrent symptoms that may result in a chronic condition.  Problems of the lower back that are caused by compensating for the injury, such as limping.  Pain or weakness of the foot during push-off following surgery.  Chronic inflammation, scarring, and partial or complete fascia tear, occurring more often from repeated injections.  TREATMENT  Treatment initially involves the use of ice and medication to help reduce pain and inflammation. The use of strengthening and stretching exercises may help reduce pain with activity, especially stretches of the Achilles tendon. These exercises may be performed at home or with a therapist. Your caregiver may recommend that you use heel cups of arch supports to help reduce stress on the plantar fascia. Occasionally, corticosteroid injections are given to reduce inflammation. If symptoms persist for greater than 6 months despite non-surgical (conservative), then surgery may be recommended.   MEDICATION   If pain medication is necessary, then nonsteroidal  anti-inflammatory medications, such as aspirin and ibuprofen, or other minor pain relievers, such as acetaminophen, are often recommended.  Do not take pain medication within 7 days before surgery.  Prescription pain relievers may be given if deemed necessary by your   caregiver. Use only as directed and only as much as you need.  Corticosteroid injections may be given by your caregiver. These injections should be reserved for the most serious cases, because they may only be given a certain number of times.  HEAT AND COLD  Cold treatment (icing) relieves pain and reduces inflammation. Cold treatment should be applied for 10 to 15 minutes every 2 to 3 hours for inflammation and pain and immediately after any activity that aggravates your symptoms. Use ice packs or massage the area with a piece of ice (ice massage).  Heat treatment may be used prior to performing the stretching and strengthening activities prescribed by your caregiver, physical therapist, or athletic trainer. Use a heat pack or soak the injury in warm water.  SEEK IMMEDIATE MEDICAL CARE IF:  Treatment seems to offer no benefit, or the condition worsens.  Any medications produce adverse side effects.  EXERCISES- RANGE OF MOTION (ROM) AND STRETCHING EXERCISES - Plantar Fasciitis (Heel Spur Syndrome) These exercises may help you when beginning to rehabilitate your injury. Your symptoms may resolve with or without further involvement from your physician, physical therapist or athletic trainer. While completing these exercises, remember:   Restoring tissue flexibility helps normal motion to return to the joints. This allows healthier, less painful movement and activity.  An effective stretch should be held for at least 30 seconds.  A stretch should never be painful. You should only feel a gentle lengthening or release in the stretched tissue.  RANGE OF MOTION - Toe Extension, Flexion  Sit with your right / left leg crossed  over your opposite knee.  Grasp your toes and gently pull them back toward the top of your foot. You should feel a stretch on the bottom of your toes and/or foot.  Hold this stretch for 10 seconds.  Now, gently pull your toes toward the bottom of your foot. You should feel a stretch on the top of your toes and or foot.  Hold this stretch for 10 seconds. Repeat  times. Complete this stretch 3 times per day.   RANGE OF MOTION - Ankle Dorsiflexion, Active Assisted  Remove shoes and sit on a chair that is preferably not on a carpeted surface.  Place right / left foot under knee. Extend your opposite leg for support.  Keeping your heel down, slide your right / left foot back toward the chair until you feel a stretch at your ankle or calf. If you do not feel a stretch, slide your bottom forward to the edge of the chair, while still keeping your heel down.  Hold this stretch for 10 seconds. Repeat 3 times. Complete this stretch 2 times per day.   STRETCH  Gastroc, Standing  Place hands on wall.  Extend right / left leg, keeping the front knee somewhat bent.  Slightly point your toes inward on your back foot.  Keeping your right / left heel on the floor and your knee straight, shift your weight toward the wall, not allowing your back to arch.  You should feel a gentle stretch in the right / left calf. Hold this position for 10 seconds. Repeat 3 times. Complete this stretch 2 times per day.  STRETCH  Soleus, Standing  Place hands on wall.  Extend right / left leg, keeping the other knee somewhat bent.  Slightly point your toes inward on your back foot.  Keep your right / left heel on the floor, bend your back knee, and slightly shift your weight   over the back leg so that you feel a gentle stretch deep in your back calf.  Hold this position for 10 seconds. Repeat 3 times. Complete this stretch 2 times per day.  STRETCH  Gastrocsoleus, Standing  Note: This exercise can place a  lot of stress on your foot and ankle. Please complete this exercise only if specifically instructed by your caregiver.   Place the ball of your right / left foot on a step, keeping your other foot firmly on the same step.  Hold on to the wall or a rail for balance.  Slowly lift your other foot, allowing your body weight to press your heel down over the edge of the step.  You should feel a stretch in your right / left calf.  Hold this position for 10 seconds.  Repeat this exercise with a slight bend in your right / left knee. Repeat 3 times. Complete this stretch 2 times per day.   STRENGTHENING EXERCISES - Plantar Fasciitis (Heel Spur Syndrome)  These exercises may help you when beginning to rehabilitate your injury. They may resolve your symptoms with or without further involvement from your physician, physical therapist or athletic trainer. While completing these exercises, remember:   Muscles can gain both the endurance and the strength needed for everyday activities through controlled exercises.  Complete these exercises as instructed by your physician, physical therapist or athletic trainer. Progress the resistance and repetitions only as guided.  STRENGTH - Towel Curls  Sit in a chair positioned on a non-carpeted surface.  Place your foot on a towel, keeping your heel on the floor.  Pull the towel toward your heel by only curling your toes. Keep your heel on the floor. Repeat 3 times. Complete this exercise 2 times per day.  STRENGTH - Ankle Inversion  Secure one end of a rubber exercise band/tubing to a fixed object (table, pole). Loop the other end around your foot just before your toes.  Place your fists between your knees. This will focus your strengthening at your ankle.  Slowly, pull your big toe up and in, making sure the band/tubing is positioned to resist the entire motion.  Hold this position for 10 seconds.  Have your muscles resist the band/tubing as it  slowly pulls your foot back to the starting position. Repeat 3 times. Complete this exercises 2 times per day.  Document Released: 05/02/2005 Document Revised: 07/25/2011 Document Reviewed: 08/14/2008 [employer] Patient Information 2014 [employer]. 

## 2017-02-10 NOTE — Progress Notes (Signed)
Subjective:    Patient ID: Anna Pugh, female   DOB: 46 y.o.   MRN: [id number]   HPI patient states her heel has started to hurt her again and she had not picked up her orthotics and also she has started an exercise program    ROS      Objective:  Physical Exam neurovascular status intact with exquisite discomfort mostly on the plantar lateral aspect of the heel left with inflammation noted and chronic flatfoot deformity     Assessment:   Acute plantar fasciitis left with inflammation of the lateral band      Plan:    Injected the plantar lateral fascia 3 mg Kenalog 5 mg Xylocaine and gave instructions on his therapy and activity levels and reappoint to recheck

## 2017-04-27 ENCOUNTER — Ambulatory Visit (INDEPENDENT_AMBULATORY_CARE_PROVIDER_SITE_OTHER): Payer: 59

## 2017-04-27 ENCOUNTER — Ambulatory Visit: Payer: 59 | Admitting: Physician Assistant

## 2017-04-27 ENCOUNTER — Other Ambulatory Visit: Payer: Self-pay

## 2017-04-27 VITALS — BP 118/84 | HR 98 | Temp 98.7°F | Resp 16 | Ht 62.0 in | Wt 204.0 lb

## 2017-04-27 DIAGNOSIS — R05 Cough: Secondary | ICD-10-CM | POA: Diagnosis not present

## 2017-04-27 DIAGNOSIS — R059 Cough, unspecified: Secondary | ICD-10-CM

## 2017-04-27 DIAGNOSIS — J209 Acute bronchitis, unspecified: Secondary | ICD-10-CM | POA: Diagnosis not present

## 2017-04-27 MED ORDER — AMOXICILLIN-POT CLAVULANATE 875-125 MG PO TABS: 1.0000 | ORAL_TABLET | Freq: Two times a day (BID) | ORAL | 0 refills | Status: AC

## 2017-04-27 MED ORDER — HYDROCOD POLST-CPM POLST ER 10-8 MG/5ML PO SUER: 5.0000 mL | Freq: Every evening | ORAL | 0 refills | Status: DC | PRN

## 2017-04-27 MED ORDER — PREDNISONE 20 MG PO TABS: ORAL_TABLET | ORAL | 0 refills | Status: DC

## 2017-04-27 NOTE — Progress Notes (Signed)
[facility] [address], [place] [phone]  Date:  04/27/2017   Name:  Anna Pugh   DOB:  10/19/1970   MRN:  [id number]  PCP:  Garnetta BuddyEnglish, Rollo Farquhar D, PA    History of Present Illness:  Anna Pugh is a 46 y.o. female patient who presents to PCP with  Chief Complaint  Patient presents with  . Follow-up    cough/ pt still coughing not better. [facility] and was given predisone and cough syrup     Patient had been ill for several weeks.  She has a cough for the last 2 weeks that has not gotten better.  About 4 days ago, she found an antibiotic which she had filled of Augmentin.  She reports that it seems to help but not better.  She was taking prednisone pack last week from an urgent care clinic.  This had helped but is finished.  She is coughing through the night.  Intermittently productive.  Has some issue with breathing with coughing a lot.  Cough is worse at night.  She was also given albuterol which had helped some.   Patient Active Problem List   Diagnosis Date Noted  . Partial epilepsy with impairment of consciousness (HCC) 01/04/2013  . Migraine without aura 01/04/2013    Past Medical History:  Diagnosis Date  . GERD (gastroesophageal reflux disease)   . Localization-related (focal) (partial) epilepsy and epileptic syndromes with complex partial seizures, without mention of intractable epilepsy 01/04/2013  . Memory deficit   . Migraine without aura, without mention of intractable migraine without mention of status migrainosus 01/04/2013  . Obesity   . Seizures (HCC)     Past Surgical History:  Procedure Laterality Date  . TONSILLECTOMY      Social History   Tobacco Use  . Smoking status: Never Smoker  . Smokeless tobacco: Never Used  Substance Use Topics  . Alcohol use: No  . Drug use: No    Family History  Problem Relation Age of Onset  . Migraines Father   . Myasthenia gravis Father   . Cancer Brother      Allergies  Allergen Reactions  . Keppra [Levetiracetam]   . Lamictal [Lamotrigine]   . Topamax [Topiramate]   . Azithromycin Other (See Comments)    Disoriented and out of breath    Medication list has been reviewed and updated.  Current Outpatient Medications on File Prior to Visit  Medication Sig Dispense Refill  . carbamazepine ([name]) 100 MG 12 hr capsule TAKE 2 CAPSULES BY MOUTH IN THE MORNING 180 capsule 3  . carbamazepine ([name]) 300 MG 12 hr capsule 300 mg in am and 600 mg in pm 270 capsule 3  . cholecalciferol (VITAMIN D) 1000 units tablet Take 1,000 Units by mouth daily.    Marland Kitchen. ibuprofen (ADVIL,MOTRIN) 600 MG tablet Take 1 tablet (600 mg total) by mouth every 8 (eight) hours as needed. 30 tablet 1  . rizatriptan (MAXALT) 10 MG tablet Take 1 tablet (10 mg total) by mouth 3 (three) times daily as needed for migraine. May repeat in 2 hours if needed 12 tablet 3   No current facility-administered medications on file prior to visit.     ROS ROS otherwise unremarkable unless listed above.  Physical Examination: There were no vitals taken for this visit. Ideal Body Weight:    Physical Exam  Constitutional: She is oriented to person, place, and time. She appears well-developed  and well-nourished. No distress.  HENT:  Head: Normocephalic and atraumatic.  Right Ear: Tympanic membrane, external ear and ear canal normal.  Left Ear: Tympanic membrane, external ear and ear canal normal.  Nose: Mucosal edema and rhinorrhea present. Right sinus exhibits no maxillary sinus tenderness and no frontal sinus tenderness. Left sinus exhibits no maxillary sinus tenderness and no frontal sinus tenderness.  Mouth/Throat: No uvula swelling. Posterior oropharyngeal edema and posterior oropharyngeal erythema present. No oropharyngeal exudate. Tonsils are 2+ on the right. Tonsils are 2+ on the left. Tonsillar exudate.  Eyes: Conjunctivae and EOM are normal. Pupils are equal, round, and  reactive to light.  Cardiovascular: Normal rate and regular rhythm. Exam reveals no gallop, no distant heart sounds and no friction rub.  No murmur heard. Pulmonary/Chest: Effort normal. No accessory muscle usage. No apnea. No respiratory distress. She has no decreased breath sounds. She has no wheezes. She has rhonchi.  Lymphadenopathy:       Head (right side): No submandibular, no tonsillar, no preauricular and no posterior auricular adenopathy present.       Head (left side): No submandibular, no tonsillar, no preauricular and no posterior auricular adenopathy present.  Neurological: She is alert and oriented to person, place, and time.  Skin: She is not diaphoretic.  Psychiatric: She has a normal mood and affect. Her behavior is normal.    Dg Chest 2 View  Result Date: 04/27/2017 CLINICAL DATA:  Cough which is not improving with antibiotic treatment. EXAM: CHEST  2 VIEW COMPARISON:  None. FINDINGS: Frontal view is degraded by motion. Heart size is normal. Mediastinal shadows are normal. I think there is a pattern of bronchial thickening most consistent with bronchitis. No evidence of consolidation or collapse. No effusions. No bone abnormality. IMPRESSION: Motion degraded frontal view. Suspicion of bronchitis pattern. No consolidation or collapse identified. Electronically Signed   By: Paulina FusiMark  Shogry M.D.   On: 04/27/2017 17:38     Assessment and Plan: Anna Pugh is a 46 y.o. female who is here today for cc of  Chief Complaint  Patient presents with  . Follow-up    cough/ pt still coughing not better. [facility] and was given predisone and cough syrup  will given 3 additional days for the Augmentin to complete 10 days for possible strep.  Given prednisone taper, and cough syrup.  Follow up in 1 week. Acute bronchitis, unspecified organism - Plan: chlorpheniramine-HYDROcodone (TUSSIONEX PENNKINETIC ER) 10-8 MG/5ML SUER  Cough - Plan: DG Chest 2 View, predniSONE  (DELTASONE) 20 MG tablet  Trena PlattStephanie Weda Baumgarner, PA-C [facility] 12/15/20188:57 PM

## 2017-04-27 NOTE — Patient Instructions (Addendum)
Please continue the mucinex twice per day.  But you must hydrate, otherwise the mucinex will make you feel worse.  Please make sure you are drinking 64 oz of water if not more.  I would like you to return in 1 week for me to recheck you unless you are feeling much better.  I am refilling some more of the cough syrup take at night.  Acute Bronchitis, Adult Acute bronchitis is when air tubes (bronchi) in the lungs suddenly get swollen. The condition can make it hard to breathe. It can also cause these symptoms:  A cough.  Coughing up clear, yellow, or green mucus.  Wheezing.  Chest congestion.  Shortness of breath.  A fever.  Body aches.  Chills.  A sore throat.  Follow these instructions at home: Medicines  Take over-the-counter and prescription medicines only as told by your doctor.  If you were prescribed an antibiotic medicine, take it as told by your doctor. Do not stop taking the antibiotic even if you start to feel better. General instructions  Rest.  Drink enough fluids to keep your pee (urine) clear or pale yellow.  Avoid smoking and secondhand smoke. If you smoke and you need help quitting, ask your doctor. Quitting will help your lungs heal faster.  Use an inhaler, cool mist vaporizer, or humidifier as told by your doctor.  Keep all follow-up visits as told by your doctor. This is important. How is this prevented? To lower your risk of getting this condition again:  Wash your hands often with soap and water. If you cannot use soap and water, use hand sanitizer.  Avoid contact with people who have cold symptoms.  Try not to touch your hands to your mouth, nose, or eyes.  Make sure to get the flu shot every year.  Contact a doctor if:  Your symptoms do not get better in 2 weeks. Get help right away if:  You cough up blood.  You have chest pain.  You have very bad shortness of breath.  You become dehydrated.  You faint (pass out) or keep feeling  like you are going to pass out.  You keep throwing up (vomiting).  You have a very bad headache.  Your fever or chills gets worse. This information is not intended to replace advice given to you by your health care provider. Make sure you discuss any questions you have with your health care provider. Document Released: 10/19/2007 Document Revised: 12/09/2015 Document Reviewed: 10/21/2015 Elsevier Interactive Patient Education  2017 [employer]   IF you received an x-ray today, you will receive an invoice from [facility]. Please contact [facility] at [phone] with questions or concerns regarding your invoice.   IF you received labwork today, you will receive an invoice from [facility]. Please contact [facility] at [phone] with questions or concerns regarding your invoice.   Our billing staff will not be able to assist you with questions regarding bills from these companies.  You will be contacted with the lab results as soon as they are available. The fastest way to get your results is to activate your My Chart account. Instructions are located on the last page of this paperwork. If you have not heard from us regarding the results in 2 weeks, please contact this office.

## 2017-04-29 ENCOUNTER — Encounter: Payer: Self-pay | Admitting: Physician Assistant

## 2017-05-04 ENCOUNTER — Ambulatory Visit: Payer: 59 | Admitting: Physician Assistant

## 2017-05-04 ENCOUNTER — Other Ambulatory Visit: Payer: Self-pay

## 2017-05-04 ENCOUNTER — Encounter: Payer: Self-pay | Admitting: Physician Assistant

## 2017-05-04 VITALS — BP 108/78 | HR 95 | Temp 98.3°F | Resp 22 | Ht 62.0 in | Wt 204.2 lb

## 2017-05-04 DIAGNOSIS — J22 Unspecified acute lower respiratory infection: Secondary | ICD-10-CM

## 2017-05-04 MED ORDER — DOXYCYCLINE HYCLATE 100 MG PO CAPS: 100.0000 mg | ORAL_CAPSULE | Freq: Two times a day (BID) | ORAL | 0 refills | Status: AC

## 2017-05-04 NOTE — Progress Notes (Signed)
[facility] AT [facility] [address], [place] [phone]  Date:  05/04/2017   Name:  Anna Pugh   DOB:  01/17/1971   MRN:  [id number]  PCP:  Garnetta BuddyEnglish, Lewie Deman D, PA    History of Present Illness:  Anna Pugh is a 46 y.o. female patient who presents to PCP with  Chief Complaint  Patient presents with  . Follow-up    f/u visit last week, bronchitis. Feeling better, still having problems catching breath. Productive cough, yellow sputum. Denies chest pain, current shortness of breath.;     Patient reports that she is feeling better after the diagnosis of pneumonia 7 days ago.  She was placed on azithromycin and advised to follow up..  She is using the albuterol as needed which she states helps when she is coughing.  She continues to have a productive cough of sputum. No fever. Feels sob when she is having a coughing fit.   Patient Active Problem List   Diagnosis Date Noted  . Partial epilepsy with impairment of consciousness (HCC) 01/04/2013  . Migraine without aura 01/04/2013    Past Medical History:  Diagnosis Date  . GERD (gastroesophageal reflux disease)   . Localization-related (focal) (partial) epilepsy and epileptic syndromes with complex partial seizures, without mention of intractable epilepsy 01/04/2013  . Memory deficit   . Migraine without aura, without mention of intractable migraine without mention of status migrainosus 01/04/2013  . Obesity   . Seizures (HCC)     Past Surgical History:  Procedure Laterality Date  . TONSILLECTOMY      Social History   Tobacco Use  . Smoking status: Never Smoker  . Smokeless tobacco: Never Used  Substance Use Topics  . Alcohol use: No  . Drug use: No    Family History  Problem Relation Age of Onset  . Migraines Father   . Myasthenia gravis Father   . Cancer Brother     Allergies  Allergen Reactions  . Keppra [Levetiracetam]   . Lamictal [Lamotrigine]   . Topamax [Topiramate]   . Azithromycin  Other (See Comments)    Disoriented and out of breath    Medication list has been reviewed and updated.  Current Outpatient Medications on File Prior to Visit  Medication Sig Dispense Refill  . amoxicillin-clavulanate (AUGMENTIN) 875-125 MG tablet Take 1 tablet by mouth 2 (two) times daily for 10 days. 6 tablet 0  . carbamazepine ([name]) 100 MG 12 hr capsule TAKE 2 CAPSULES BY MOUTH IN THE MORNING 180 capsule 3  . carbamazepine ([name]) 300 MG 12 hr capsule 300 mg in am and 600 mg in pm 270 capsule 3  . chlorpheniramine-HYDROcodone (TUSSIONEX PENNKINETIC ER) 10-8 MG/5ML SUER Take 5 mLs by mouth at bedtime as needed. 115 mL 0  . cholecalciferol (VITAMIN D) 1000 units tablet Take 1,000 Units by mouth daily.    . predniSONE (DELTASONE) 20 MG tablet Take 3 PO QAM x2days, 2 PO QAM x2days, 1 PO QAM x3days 13 tablet 0  . rizatriptan (MAXALT) 10 MG tablet Take 1 tablet (10 mg total) by mouth 3 (three) times daily as needed for migraine. May repeat in 2 hours if needed 12 tablet 3   No current facility-administered medications on file prior to visit.     ROS ROS otherwise unremarkable unless listed above.  Physical Examination: BP 108/78   Pulse 95   Temp 98.3 F (36.8 C)   Resp (!) 22   Ht 5\' 2"  (1.575 m)  Wt 204 lb 3.2 oz (92.6 kg)   LMP 04/18/2017 (Within Days)   SpO2 96%   BMI 37.35 kg/m  Ideal Body Weight: Weight in (lb) to have BMI = 25: 136.4  Physical Exam  Constitutional: She is oriented to person, place, and time. She appears well-developed and well-nourished. No distress.  HENT:  Head: Normocephalic and atraumatic.  Right Ear: External ear normal.  Left Ear: External ear normal.  Eyes: Conjunctivae and EOM are normal. Pupils are equal, round, and reactive to light.  Cardiovascular: Normal rate.  Pulmonary/Chest: Effort normal. No accessory muscle usage. No apnea. No respiratory distress. She has no decreased breath sounds. She has rhonchi (mild).  Neurological:  She is alert and oriented to person, place, and time.  Skin: She is not diaphoretic.  Psychiatric: She has a normal mood and affect. Her behavior is normal.     Assessment and Plan: Anna Pugh is a 46 y.o. female who is here today for cc of  Chief Complaint  Patient presents with  . Follow-up    f/u visit last week, bronchitis. Feeling better, still having problems catching breath. Productive cough, yellow sputum. Denies chest pain, current shortness of breath.;  we will add doxycycline at this time.  Advised to follow up  As needed. Lower respiratory infection (e.g., bronchitis, pneumonia, pneumonitis, pulmonitis) - Plan: doxycycline (VIBRAMYCIN) 100 MG capsule  Trena PlattStephanie Kaydince Towles, PA-C [facility] 12/29/20188:06 PM

## 2017-05-04 NOTE — Patient Instructions (Addendum)
  Make sure you are hydrating well with 64 oz of water.   Please take antibiotic as prescribed.  We will guard for atypicals.      IF you received an x-ray today, you will receive an invoice from [facility]. Please contact [facility] at [phone] with questions or concerns regarding your invoice.   IF you received labwork today, you will receive an invoice from [facility]. Please contact [facility] at [phone] with questions or concerns regarding your invoice.   Our billing staff will not be able to assist you with questions regarding bills from these companies.  You will be contacted with the lab results as soon as they are available. The fastest way to get your results is to activate your My Chart account. Instructions are located on the last page of this paperwork. If you have not heard from us regarding the results in 2 weeks, please contact this office.

## 2017-05-24 ENCOUNTER — Other Ambulatory Visit: Payer: Self-pay

## 2017-05-24 ENCOUNTER — Ambulatory Visit (INDEPENDENT_AMBULATORY_CARE_PROVIDER_SITE_OTHER): Payer: 59

## 2017-05-24 ENCOUNTER — Encounter: Payer: Self-pay | Admitting: Physician Assistant

## 2017-05-24 ENCOUNTER — Ambulatory Visit: Payer: 59 | Admitting: Physician Assistant

## 2017-05-24 VITALS — BP 132/68 | HR 72 | Temp 98.6°F | Ht 63.0 in | Wt 206.6 lb

## 2017-05-24 DIAGNOSIS — M25562 Pain in left knee: Secondary | ICD-10-CM

## 2017-05-24 DIAGNOSIS — M1712 Unilateral primary osteoarthritis, left knee: Secondary | ICD-10-CM

## 2017-05-24 NOTE — Patient Instructions (Addendum)
IF you received an x-ray today, you will receive an invoice from [facility]. Please contact [facility] at [phone] with questions or concerns regarding your invoice.   IF you received labwork today, you will receive an invoice from [facility]. Please contact [facility] at [phone] with questions or concerns regarding your invoice.   Our billing staff will not be able to assist you with questions regarding bills from these companies.  You will be contacted with the lab results as soon as they are available. The fastest way to get your results is to activate your My Chart account. Instructions are located on the last page of this paperwork. If you have not heard from us regarding the results in 2 weeks, please contact this office.   I would like you to pick three pictures.  You will perform these three stretches, three times per day.  Do them the number of times repeated.  Please ice the knee three times per day for 15 minutes You can take ibuprofen 600mg  every 8 hours as needed.   Knee Exercises Ask your health care provider which exercises are safe for you. Do exercises exactly as told by your health care provider and adjust them as directed. It is normal to feel mild stretching, pulling, tightness, or discomfort as you do these exercises, but you should stop right away if you feel sudden pain or your pain gets worse.Do not begin these exercises until told by your health care provider. STRETCHING AND RANGE OF MOTION EXERCISES These exercises warm up your muscles and joints and improve the movement and flexibility of your knee. These exercises also help to relieve pain, numbness, and tingling. Exercise A: Knee Extension, Prone 1. Lie on your abdomen on a bed. 2. Place your left / right knee just beyond the edge of the surface so your knee is not on the bed. You can put a towel under your left / right thigh just above your knee for comfort. 3. Relax your leg muscles  and allow gravity to straighten your knee. You should feel a stretch behind your left / right knee. 4. Hold this position for __________ seconds. 5. Scoot up so your knee is supported between repetitions. Repeat __________ times. Complete this stretch __________ times a day. Exercise B: Knee Flexion, Active  1. Lie on your back with both knees straight. If this causes back discomfort, bend your left / right knee so your foot is flat on the floor. 2. Slowly slide your left / right heel back toward your buttocks until you feel a gentle stretch in the front of your knee or thigh. 3. Hold this position for __________ seconds. 4. Slowly slide your left / right heel back to the starting position. Repeat __________ times. Complete this exercise __________ times a day. Exercise C: Quadriceps, Prone  1. Lie on your abdomen on a firm surface, such as a bed or padded floor. 2. Bend your left / right knee and hold your ankle. If you cannot reach your ankle or pant leg, loop a belt around your foot and grab the belt instead. 3. Gently pull your heel toward your buttocks. Your knee should not slide out to the side. You should feel a stretch in the front of your thigh and knee. 4. Hold this position for __________ seconds. Repeat __________ times. Complete this stretch __________ times a day. Exercise D: Hamstring, Supine 1. Lie on your back. 2. Loop a belt or towel over the ball of your  left / right foot. The ball of your foot is on the walking surface, right under your toes. 3. Straighten your left / right knee and slowly pull on the belt to raise your leg until you feel a gentle stretch behind your knee. ? Do not let your left / right knee bend while you do this. ? Keep your other leg flat on the floor. 4. Hold this position for __________ seconds. Repeat __________ times. Complete this stretch __________ times a day. STRENGTHENING EXERCISES These exercises build strength and endurance in your knee.  Endurance is the ability to use your muscles for a long time, even after they get tired. Exercise E: Quadriceps, Isometric  1. Lie on your back with your left / right leg extended and your other knee bent. Put a rolled towel or small pillow under your knee if told by your health care provider. 2. Slowly tense the muscles in the front of your left / right thigh. You should see your kneecap slide up toward your hip or see increased dimpling just above the knee. This motion will push the back of the knee toward the floor. 3. For __________ seconds, keep the muscle as tight as you can without increasing your pain. 4. Relax the muscles slowly and completely. Repeat __________ times. Complete this exercise __________ times a day. Exercise F: Straight Leg Raises - Quadriceps 1. Lie on your back with your left / right leg extended and your other knee bent. 2. Tense the muscles in the front of your left / right thigh. You should see your kneecap slide up or see increased dimpling just above the knee. Your thigh may even shake a bit. 3. Keep these muscles tight as you raise your leg 4-6 inches (10-15 cm) off the floor. Do not let your knee bend. 4. Hold this position for __________ seconds. 5. Keep these muscles tense as you lower your leg. 6. Relax your muscles slowly and completely after each repetition. Repeat __________ times. Complete this exercise __________ times a day. Exercise G: Hamstring, Isometric 1. Lie on your back on a firm surface. 2. Bend your left / right knee approximately __________ degrees. 3. Dig your left / right heel into the surface as if you are trying to pull it toward your buttocks. Tighten the muscles in the back of your thighs to dig as hard as you can without increasing any pain. 4. Hold this position for __________ seconds. 5. Release the tension gradually and allow your muscles to relax completely for __________ seconds after each repetition. Repeat __________ times.  Complete this exercise __________ times a day. Exercise H: Hamstring Curls  If told by your health care provider, do this exercise while wearing ankle weights. Begin with __________ weights. Then increase the weight by 1 lb (0.5 kg) increments. Do not wear ankle weights that are more than __________. 1. Lie on your abdomen with your legs straight. 2. Bend your left / right knee as far as you can without feeling pain. Keep your hips flat against the floor. 3. Hold this position for __________ seconds. 4. Slowly lower your leg to the starting position.  Repeat __________ times. Complete this exercise __________ times a day. Exercise I: Squats (Quadriceps) 1. Stand in front of a table, with your feet and knees pointing straight ahead. You may rest your hands on the table for balance but not for support. 2. Slowly bend your knees and lower your hips like you are going to sit in a chair. ?  Keep your weight over your heels, not over your toes. ? Keep your lower legs upright so they are parallel with the table legs. ? Do not let your hips go lower than your knees. ? Do not bend lower than told by your health care provider. ? If your knee pain increases, do not bend as low. 3. Hold the squat position for __________ seconds. 4. Slowly push with your legs to return to standing. Do not use your hands to pull yourself to standing. Repeat __________ times. Complete this exercise __________ times a day. Exercise J: Wall Slides (Quadriceps)  1. Lean your back against a smooth wall or door while you walk your feet out 18-24 inches (46-61 cm) from it. 2. Place your feet hip-width apart. 3. Slowly slide down the wall or door until your knees bend __________ degrees. Keep your knees over your heels, not over your toes. Keep your knees in line with your hips. 4. Hold for __________ seconds. Repeat __________ times. Complete this exercise __________ times a day. Exercise K: Straight Leg Raises - Hip  Abductors 1. Lie on your side with your left / right leg in the top position. Lie so your head, shoulder, knee, and hip line up. You may bend your bottom knee to help you keep your balance. 2. Roll your hips slightly forward so your hips are stacked directly over each other and your left / right knee is facing forward. 3. Leading with your heel, lift your top leg 4-6 inches (10-15 cm). You should feel the muscles in your outer hip lifting. ? Do not let your foot drift forward. ? Do not let your knee roll toward the ceiling. 4. Hold this position for __________ seconds. 5. Slowly return your leg to the starting position. 6. Let your muscles relax completely after each repetition. Repeat __________ times. Complete this exercise __________ times a day. Exercise L: Straight Leg Raises - Hip Extensors 1. Lie on your abdomen on a firm surface. You can put a pillow under your hips if that is more comfortable. 2. Tense the muscles in your buttocks and lift your left / right leg about 4-6 inches (10-15 cm). Keep your knee straight as you lift your leg. 3. Hold this position for __________ seconds. 4. Slowly lower your leg to the starting position. 5. Let your leg relax completely after each repetition. Repeat __________ times. Complete this exercise __________ times a day. This information is not intended to replace advice given to you by your health care provider. Make sure you discuss any questions you have with your health care provider. Document Released: 03/16/2005 Document Revised: 01/25/2016 Document Reviewed: 03/08/2015 Elsevier Interactive Patient Education  2018 [employer].

## 2017-05-24 NOTE — Progress Notes (Signed)
[facility] [address], [place] [address] [phone]  Date:  05/24/2017   Name:  Anna Pugh   DOB:  11/17/1970   MRN:  [id number]  PCP:  Garnetta BuddyEnglish, Stephanie D, PA    History of Present Illness:  Anna Pugh is a 47 y.o. female patient who presents to PCP with  Chief Complaint  Patient presents with  . Knee Pain    AFTER GOING TO GYM STARTING TO FEEL THE KNEE PAIN.      She had returned to the gym frm 1 month of being away. 4 days ago she did the elliptical and step class and squats.  she She has no pain with sitting.   When she standsl and walks, it is very painful.  Radiates down and behind the knee.  No warmth.  She felt like her knee would buckle, when she stood up.    Patient Active Problem List   Diagnosis Date Noted  . Partial epilepsy with impairment of consciousness (HCC) 01/04/2013  . Migraine without aura 01/04/2013    Past Medical History:  Diagnosis Date  . GERD (gastroesophageal reflux disease)   . Localization-related (focal) (partial) epilepsy and epileptic syndromes with complex partial seizures, without mention of intractable epilepsy 01/04/2013  . Memory deficit   . Migraine without aura, without mention of intractable migraine without mention of status migrainosus 01/04/2013  . Obesity   . Seizures (HCC)     Past Surgical History:  Procedure Laterality Date  . TONSILLECTOMY      Social History   Tobacco Use  . Smoking status: Never Smoker  . Smokeless tobacco: Never Used  Substance Use Topics  . Alcohol use: No  . Drug use: No    Family History  Problem Relation Age of Onset  . Migraines Father   . Myasthenia gravis Father   . Cancer Brother     Allergies  Allergen Reactions  . Keppra [Levetiracetam]   . Lamictal [Lamotrigine]   . Topamax [Topiramate]   . Azithromycin Other (See Comments)    Disoriented and out of breath    Medication list has been reviewed and updated.  Current Outpatient Medications on File  Prior to Visit  Medication Sig Dispense Refill  . carbamazepine ([name]) 300 MG 12 hr capsule 300 mg in am and 600 mg in pm 270 capsule 3  . cholecalciferol (VITAMIN D) 1000 units tablet Take 1,000 Units by mouth daily.    . rizatriptan (MAXALT) 10 MG tablet Take 1 tablet (10 mg total) by mouth 3 (three) times daily as needed for migraine. May repeat in 2 hours if needed 12 tablet 3  . carbamazepine ([name]) 100 MG 12 hr capsule TAKE 2 CAPSULES BY MOUTH IN THE MORNING (Patient not taking: Reported on 05/24/2017) 180 capsule 3  . chlorpheniramine-HYDROcodone (TUSSIONEX PENNKINETIC ER) 10-8 MG/5ML SUER Take 5 mLs by mouth at bedtime as needed. (Patient not taking: Reported on 05/24/2017) 115 mL 0  . predniSONE (DELTASONE) 20 MG tablet Take 3 PO QAM x2days, 2 PO QAM x2days, 1 PO QAM x3days (Patient not taking: Reported on 05/24/2017) 13 tablet 0   No current facility-administered medications on file prior to visit.     ROS ROS otherwise unremarkable unless listed above.  Physical Examination: BP 132/68 (BP Location: Left Arm, Patient Position: Sitting, Cuff Size: Large)   Pulse 72   Temp 98.6 F (37 C) (Oral)   Ht 5\' 3"  (1.6 m)   Wt 206 lb 9.6 oz (  93.7 kg)   SpO2 96%   BMI 36.60 kg/m  Ideal Body Weight: Weight in (lb) to have BMI = 25: 140.8  Physical Exam  Constitutional: She is oriented to person, place, and time. She appears well-developed and well-nourished. No distress.  HENT:  Head: Normocephalic and atraumatic.  Right Ear: External ear normal.  Left Ear: External ear normal.  Eyes: Conjunctivae and EOM are normal. Pupils are equal, round, and reactive to light.  Cardiovascular: Normal rate and regular rhythm.  Pulmonary/Chest: Effort normal. No respiratory distress. She has no wheezes.  Musculoskeletal:       Left knee: She exhibits effusion. She exhibits no ecchymosis. Tenderness found. Lateral joint line tenderness noted. No LCL and no patellar tendon tenderness noted.   Left knee tenderness just lateral of the patella but moreso tender at the lateral superior femoral  Condyle.   Neurological: She is alert and oriented to person, place, and time.  Skin: She is not diaphoretic.  Psychiatric: She has a normal mood and affect. Her behavior is normal.   Dg Chest 2 View  Result Date: 04/27/2017 CLINICAL DATA:  Cough which is not improving with antibiotic treatment. EXAM: CHEST  2 VIEW COMPARISON:  None. FINDINGS: Frontal view is degraded by motion. Heart size is normal. Mediastinal shadows are normal. I think there is a pattern of bronchial thickening most consistent with bronchitis. No evidence of consolidation or collapse. No effusions. No bone abnormality. IMPRESSION: Motion degraded frontal view. Suspicion of bronchitis pattern. No consolidation or collapse identified. Electronically Signed   By: Paulina Fusi M.D.   On: 04/27/2017 17:38   Dg Knee Complete 4 Views Left  Result Date: 05/24/2017 CLINICAL DATA:  47 year old female with history of left-sided knee pain and swelling in the lateral aspect of the left patella. EXAM: LEFT KNEE - COMPLETE 4+ VIEW COMPARISON:  He no priors. FINDINGS: No evidence of fracture, dislocation, or joint effusion. Joint space narrowing, subchondral sclerosis, subchondral cyst formation and osteophyte formation noted in a tricompartmental distribution, compatible with moderate to severe osteoarthritis. Soft tissues are unremarkable. IMPRESSION: 1. No acute radiographic abnormality of the left knee. 2. Moderate to severe tricompartmental osteoarthritis. Electronically Signed   By: Trudie Reed M.D.   On: 05/24/2017 17:42     Assessment and Plan: Anna Pugh is a 47 y.o. female who is here today for cc of  Chief Complaint  Patient presents with  . Knee Pain    AFTER GOING TO GYM STARTING TO FEEL THE KNEE PAIN.   --nsaid use and rice instruction.  Given stretch regimen as well. --follow up in 2-4 weeks Tricompartment  osteoarthritis of left knee  Acute pain of left knee - Plan: DG Knee Complete 4 Views Left  Trena Platt, PA-C [facility] 1/13/20196:05 PM

## 2017-05-25 ENCOUNTER — Telehealth: Payer: Self-pay | Admitting: Physician Assistant

## 2017-05-25 NOTE — Telephone Encounter (Signed)
Patient presents in clinic for knee brace troubleshooting. She states her brace is slipping down. She was dispensed a medial right/lateral left knee XXL brace does not fit. Patient's knee brace slipped down again after patient exited clinic.   RN called DME supplier. DME supplier will order XL medial right/lateral left knee brace, will be in clinic Friday or Monday for patient fitting and pickup.  Patient called, left message notifying her of the above message. Please call if she needs anything else, otherwise will call again when brace is in clinic.

## 2017-05-26 NOTE — Telephone Encounter (Signed)
Copied from CRM [phone]#[id number]. Topic: General - Other >> May 26, 2017 12:31 PM Anna Pugh, Selina wrote: Reason for CRM: Patient calling to see if her knee brace is in yet, requesting a call back.

## 2017-05-29 NOTE — Telephone Encounter (Signed)
Called spoke with Anna RuizJohn there has not been a knee brace delivered yet.

## 2017-05-30 NOTE — Telephone Encounter (Signed)
°  Relation to pt: self Call back number: [phone]   Reason for call:  Patient checking on the status of knee brace, informed patient of message below and advised office will follow up when brace is delivered.

## 2017-05-31 NOTE — Telephone Encounter (Signed)
Do you know what specific brace this is?

## 2017-06-01 NOTE — Telephone Encounter (Signed)
[employer] knee brace size XL Medial right/lateral left found in cabinet in [address]. Phone call to patient. She states she will drop by around 3pm at [address] for fitting.

## 2017-06-14 ENCOUNTER — Encounter: Payer: Self-pay | Admitting: Physician Assistant

## 2017-06-14 ENCOUNTER — Ambulatory Visit: Payer: 59 | Admitting: Physician Assistant

## 2017-06-14 VITALS — BP 128/80 | HR 72 | Temp 98.2°F | Resp 17 | Ht 63.5 in | Wt 208.0 lb

## 2017-06-14 DIAGNOSIS — M1712 Unilateral primary osteoarthritis, left knee: Secondary | ICD-10-CM | POA: Diagnosis not present

## 2017-06-14 NOTE — Progress Notes (Signed)
[facility] [address], [place] [id number] [phone]  Date:  06/14/2017   Name:  Anna Pugh   DOB:  06/30/1970   MRN:  [id number]  PCP:  Garnetta BuddyEnglish, Stephanie D, PA    History of Present Illness:  Anna Pugh is a 47 y.o. female patient who presents to PCP with  Chief Complaint  Patient presents with  . Knee Pain    left      Patient is here for follow up of left knee pain.  She was seen here 3 weeks ago with dx of tricompartment osteoarthritis.  Given knee brace.  Advised RICE and knee exercises.  Patient notes great improvement of the knee pain.  She has been able to return to her exercises though has been slow to perform movements that have pivoting involved.   Left knee pain  Patient Active Problem List   Diagnosis Date Noted  . Partial epilepsy with impairment of consciousness (HCC) 01/04/2013  . Migraine without aura 01/04/2013    Past Medical History:  Diagnosis Date  . GERD (gastroesophageal reflux disease)   . Localization-related (focal) (partial) epilepsy and epileptic syndromes with complex partial seizures, without mention of intractable epilepsy 01/04/2013  . Memory deficit   . Migraine without aura, without mention of intractable migraine without mention of status migrainosus 01/04/2013  . Obesity   . Seizures (HCC)     Past Surgical History:  Procedure Laterality Date  . TONSILLECTOMY      Social History   Tobacco Use  . Smoking status: Never Smoker  . Smokeless tobacco: Never Used  Substance Use Topics  . Alcohol use: No  . Drug use: No    Family History  Problem Relation Age of Onset  . Migraines Father   . Myasthenia gravis Father   . Cancer Brother     Allergies  Allergen Reactions  . Keppra [Levetiracetam]   . Lamictal [Lamotrigine]   . Topamax [Topiramate]   . Azithromycin Other (See Comments)    Disoriented and out of breath    Medication list has been reviewed and updated.  Current Outpatient Medications on  File Prior to Visit  Medication Sig Dispense Refill  . carbamazepine ([name]) 100 MG 12 hr capsule TAKE 2 CAPSULES BY MOUTH IN THE MORNING 180 capsule 3  . carbamazepine ([name]) 300 MG 12 hr capsule 300 mg in am and 600 mg in pm 270 capsule 3  . rizatriptan (MAXALT) 10 MG tablet Take 1 tablet (10 mg total) by mouth 3 (three) times daily as needed for migraine. May repeat in 2 hours if needed 12 tablet 3  . chlorpheniramine-HYDROcodone (TUSSIONEX PENNKINETIC ER) 10-8 MG/5ML SUER Take 5 mLs by mouth at bedtime as needed. (Patient not taking: Reported on 05/24/2017) 115 mL 0  . cholecalciferol (VITAMIN D) 1000 units tablet Take 1,000 Units by mouth daily.     No current facility-administered medications on file prior to visit.     ROS ROS otherwise unremarkable unless listed above.  Physical Examination: BP 128/80   Pulse 72   Temp 98.2 F (36.8 C) (Oral)   Resp 17   Ht 5' 3.5" (1.613 m)   Wt 208 lb (94.3 kg)   LMP 05/24/2017 (Approximate)   SpO2 98%   BMI 36.27 kg/m  Ideal Body Weight: Weight in (lb) to have BMI = 25: 143.1  Physical Exam  Constitutional: She is oriented to person, place, and time. She appears well-developed and well-nourished. No distress.  HENT:  Head: Normocephalic and atraumatic.  Right Ear: External ear normal.  Left Ear: External ear normal.  Eyes: Conjunctivae and EOM are normal. Pupils are equal, round, and reactive to light.  Cardiovascular: Normal rate.  Pulmonary/Chest: Effort normal. No respiratory distress.  Neurological: She is alert and oriented to person, place, and time.  Skin: She is not diaphoretic.  Psychiatric: She has a normal mood and affect. Her behavior is normal.   Dg Knee Complete 4 Views Left  Result Date: 05/24/2017 CLINICAL DATA:  47 year old female with history of left-sided knee pain and swelling in the lateral aspect of the left patella. EXAM: LEFT KNEE - COMPLETE 4+ VIEW COMPARISON:  He no priors. FINDINGS: No evidence of  fracture, dislocation, or joint effusion. Joint space narrowing, subchondral sclerosis, subchondral cyst formation and osteophyte formation noted in a tricompartmental distribution, compatible with moderate to severe osteoarthritis. Soft tissues are unremarkable. IMPRESSION: 1. No acute radiographic abnormality of the left knee. 2. Moderate to severe tricompartmental osteoarthritis. Electronically Signed   By: Trudie Reed M.D.   On: 05/24/2017 17:42      Assessment and Plan: Anna Pugh is a 47 y.o. female who is here today for cc of  Chief Complaint  Patient presents with  . Knee Pain    left    Pain is controlled.  She will continue to perform stretches.  Advised to ice the knee after her workouts.  If pain resurfaces, it will be advantageous for referral to an orthopedist for consult, and tracking progression.    Tricompartment osteoarthritis of left knee  Anna Platt, PA-C [facility] 2/8/20191:19 PM

## 2017-06-14 NOTE — Patient Instructions (Addendum)
It looks like you are progressing well! Continue to ice the knee whenever you do your stretches, and directly after your workouts. Let me know if you have issues with the knee. You can use a hydrocortisone cream for the rash.     IF you received an x-ray today, you will receive an invoice from [facility]. Please contact [facility] at [phone] with questions or concerns regarding your invoice.   IF you received labwork today, you will receive an invoice from [facility]. Please contact [facility] at [phone] with questions or concerns regarding your invoice.   Our billing staff will not be able to assist you with questions regarding bills from these companies.  You will be contacted with the lab results as soon as they are available. The fastest way to get your results is to activate your My Chart account. Instructions are located on the last page of this paperwork. If you have not heard from us regarding the results in 2 weeks, please contact this office.

## 2017-08-16 ENCOUNTER — Encounter: Payer: Self-pay | Admitting: Physician Assistant

## 2018-01-04 ENCOUNTER — Other Ambulatory Visit: Payer: Self-pay | Admitting: Neurology

## 2018-01-12 ENCOUNTER — Other Ambulatory Visit: Payer: Self-pay | Admitting: Neurology

## 2018-01-16 ENCOUNTER — Other Ambulatory Visit: Payer: Self-pay | Admitting: Neurology

## 2018-01-18 ENCOUNTER — Telehealth: Payer: Self-pay | Admitting: Neurology

## 2018-01-18 MED ORDER — CARBAMAZEPINE ER 100 MG PO TB12: 300.0000 mg | ORAL_TABLET | ORAL | 3 refills | Status: DC

## 2018-01-18 MED ORDER — CARBAMAZEPINE ER 200 MG PO TB12: ORAL_TABLET | ORAL | 3 refills | Status: DC

## 2018-01-18 NOTE — Telephone Encounter (Signed)
[id number] - [place], [place] - [address] AT [facility] [address] & [address] [phone]  Christiane Ha @ [facility] states the manufacturethat was being used for pt's carbamazepine ([name])  Is no longer being produced.  Christiane Ha is asking for a call back to know if they have permission to use another manufacture.  Please call

## 2018-01-18 NOTE — Telephone Encounter (Signed)
Apparently [name] is no longer being made, we will switch the patient to Tegretol-XR taking 500 mg in the morning and 600 mg in the evening.

## 2018-01-18 NOTE — Telephone Encounter (Signed)
Dr. Anne Hahn- do you authorize this change? She has upcoming f/u on 01/22/18 with [facility]

## 2018-01-18 NOTE — Addendum Note (Signed)
Addended by: York Spaniel on: 01/18/2018 05:08 PM   Modules accepted: Orders

## 2018-01-22 ENCOUNTER — Encounter: Payer: Self-pay | Admitting: Adult Health

## 2018-01-22 ENCOUNTER — Ambulatory Visit: Payer: 59 | Admitting: Adult Health

## 2018-01-22 VITALS — BP 121/82 | HR 71 | Ht 63.5 in | Wt 212.0 lb

## 2018-01-22 DIAGNOSIS — Z5181 Encounter for therapeutic drug level monitoring: Secondary | ICD-10-CM

## 2018-01-22 DIAGNOSIS — G40209 Localization-related (focal) (partial) symptomatic epilepsy and epileptic syndromes with complex partial seizures, not intractable, without status epilepticus: Secondary | ICD-10-CM

## 2018-01-22 DIAGNOSIS — G43009 Migraine without aura, not intractable, without status migrainosus: Secondary | ICD-10-CM

## 2018-01-22 MED ORDER — CARBAMAZEPINE ER 300 MG PO CP12: ORAL_CAPSULE | ORAL | 3 refills | Status: DC

## 2018-01-22 MED ORDER — CARBAMAZEPINE ER 100 MG PO TB12: 200.0000 mg | ORAL_TABLET | ORAL | 3 refills | Status: DC

## 2018-01-22 NOTE — Progress Notes (Signed)
PATIENT: Anna Pugh DOB: Dec 14, 1970  REASON FOR VISIT: follow up HISTORY FROM: patient  HISTORY OF PRESENT ILLNESS: Today 01/22/18:  Ms. Marsicano is a 47 year old female with a history of seizures and migraine headaches.  She returns today for follow-up.  She reports that she has been doing very well.  Not had any seizure events and no migraines in the last year.  She reports that her current dose of [name] is working well for her.  She is able to complete all ADLs independently.  She operates a Librarian, academic without difficulty.  Denies any changes in her gait or balance.  She returns today for evaluation.  HISTORY Ms. Paszkiewicz is a 47 year old left-handed white female with a history of seizures and migraine headaches. The patient has done quite well with both issues, she has not had any migraine headaches since last seen one year ago, she has not had any recurring seizures. She is on maximum doses of [name], she is tolerating the medication well. She denies drowsiness or gait instability. She is not on vitamin D supplementation. She returns for routine reevaluation. No other significant medical issues have come up since last seen.  REVIEW OF SYSTEMS: Out of a complete 14 system review of symptoms, the patient complains only of the following symptoms, and all other reviewed systems are negative.  See HPI  ALLERGIES: Allergies  Allergen Reactions  . Keppra [Levetiracetam]   . Lamictal [Lamotrigine]   . Topamax [Topiramate]   . Azithromycin Other (See Comments)    Disoriented and out of breath    HOME MEDICATIONS: Outpatient Medications Prior to Visit  Medication Sig Dispense Refill  . carbamazepine (TEGRETOL-XR) 100 MG 12 hr tablet Take 3 tablets (300 mg total) by mouth every morning. 270 tablet 3  . carbamazepine (TEGRETOL-XR) 200 MG 12 hr tablet 1 tablet in the morning, 3 in the evening 360 tablet 3  . chlorpheniramine-HYDROcodone (TUSSIONEX PENNKINETIC ER) 10-8 MG/5ML SUER  Take 5 mLs by mouth at bedtime as needed. 115 mL 0  . rizatriptan (MAXALT) 10 MG tablet Take 1 tablet (10 mg total) by mouth 3 (three) times daily as needed for migraine. May repeat in 2 hours if needed 12 tablet 3  . cholecalciferol (VITAMIN D) 1000 units tablet Take 1,000 Units by mouth daily.     No facility-administered medications prior to visit.     PAST MEDICAL HISTORY: Past Medical History:  Diagnosis Date  . GERD (gastroesophageal reflux disease)   . Localization-related (focal) (partial) epilepsy and epileptic syndromes with complex partial seizures, without mention of intractable epilepsy 01/04/2013  . Memory deficit   . Migraine without aura, without mention of intractable migraine without mention of status migrainosus 01/04/2013  . Obesity   . Seizures (HCC)     PAST SURGICAL HISTORY: Past Surgical History:  Procedure Laterality Date  . TONSILLECTOMY      FAMILY HISTORY: Family History  Problem Relation Age of Onset  . Migraines Father   . Myasthenia gravis Father   . Cancer Brother     SOCIAL HISTORY: Social History   Socioeconomic History  . Marital status: Single    Spouse name: Not on file  . Number of children: 0  . Years of education: Bachelor  . Highest education level: Not on file  Occupational History    Comment: Employed by [employer]  Social Needs  . Financial resource strain: Not on file  . Food insecurity:    Worry: Not on file  Inability: Not on file  . Transportation needs:    Medical: Not on file    Non-medical: Not on file  Tobacco Use  . Smoking status: Never Smoker  . Smokeless tobacco: Never Used  Substance and Sexual Activity  . Alcohol use: No  . Drug use: No  . Sexual activity: Not on file  Lifestyle  . Physical activity:    Days per week: Not on file    Minutes per session: Not on file  . Stress: Not on file  Relationships  . Social connections:    Talks on phone: Not on file    Gets together: Not on file     Attends religious service: Not on file    Active member of club or organization: Not on file    Attends meetings of clubs or organizations: Not on file    Relationship status: Not on file  . Intimate partner violence:    Fear of current or ex partner: Not on file    Emotionally abused: Not on file    Physically abused: Not on file    Forced sexual activity: Not on file  Other Topics Concern  . Not on file  Social History Narrative   Patient is single with no children.   Patient is left handed.   Patient has a Bachelor's degree.   Patient drinks 3 cups daily.      PHYSICAL EXAM  Vitals:   01/22/18 0709  BP: 121/82  Pulse: 71  Weight: 212 lb (96.2 kg)  Height: 5' 3.5" (1.613 m)   Body mass index is 36.97 kg/m.  Generalized: Well developed, in no acute distress   Neurological examination  Mentation: Alert oriented to time, place, history taking. Follows all commands speech and language fluent Cranial nerve II-XII: Pupils were equal round reactive to light. Extraocular movements were full, visual field were full on confrontational test. Facial sensation and strength were normal. Uvula tongue midline. Head turning and shoulder shrug  were normal and symmetric. Motor: The motor testing reveals 5 over 5 strength of all 4 extremities. Good symmetric motor tone is noted throughout.  Sensory: Sensory testing is intact to soft touch on all 4 extremities. No evidence of extinction is noted.  Coordination: Cerebellar testing reveals good finger-nose-finger and heel-to-shin bilaterally.  Gait and station: Gait is normal. Tandem gait is normal. Romberg is negative. No drift is seen.  Reflexes: Deep tendon reflexes are symmetric and normal bilaterally.   DIAGNOSTIC DATA (LABS, IMAGING, TESTING) - I reviewed patient records, labs, notes, testing and imaging myself where available.  Lab Results  Component Value Date   WBC 6.2 01/19/2017   HGB 12.9 01/19/2017   HCT 39.2 01/19/2017    MCV 87 01/19/2017   PLT 342 01/19/2017      Component Value Date/Time   NA 140 01/19/2017 0759   K 4.2 01/19/2017 0759   CL 101 01/19/2017 0759   CO2 23 01/19/2017 0759   GLUCOSE 82 01/19/2017 0759   BUN 13 01/19/2017 0759   CREATININE 0.71 01/19/2017 0759   CALCIUM 9.2 01/19/2017 0759   PROT 7.3 01/19/2017 0759   ALBUMIN 4.3 01/19/2017 0759   AST 24 01/19/2017 0759   ALT 20 01/19/2017 0759   ALKPHOS 93 01/19/2017 0759   BILITOT <0.2 01/19/2017 0759   GFRNONAA 102 01/19/2017 0759   GFRAA 117 01/19/2017 0759   No results found for: CHOL, HDL, LDLCALC, LDLDIRECT, TRIG, CHOLHDL No results found for: OLMB8M Lab Results  Component Value  Date   VITAMINB12 447 01/04/2013   Lab Results  Component Value Date   TSH 2.790 01/04/2013      ASSESSMENT AND PLAN 47 y.o. year old female  has a past medical history of GERD (gastroesophageal reflux disease), Localization-related (focal) (partial) epilepsy and epileptic syndromes with complex partial seizures, without mention of intractable epilepsy (01/04/2013), Memory deficit, Migraine without aura, without mention of intractable migraine without mention of status migrainosus (01/04/2013), Obesity, and Seizures (HCC). here with:  1.  Migraine headache 2.  Seizures  Overall the patient has done well.  She will continue on [name] SR. she has both the 300 mg tablet and the 100 mg tablet.  She takes 300 mg every morning and 600 mg at bedtime.  She takes 200 mg every morning.  In total she takes 500 mg in the morning and 600 mg at bedtime.  We will check blood work today.  She is advised that if her symptoms worsen or she develops new symptoms she should let us know.  He will follow-up in 6 months or sooner if needed.    Butch Penny, MSN, NP-C 01/22/2018, 7:27 AM [facility] [address], [address] [place], [place] [address] [phone]

## 2018-01-22 NOTE — Patient Instructions (Signed)
Your Plan:  Continue [name]  If your symptoms worsen or you develop new symptoms please let us know.   Thank you for coming to see [place] at [facility]. I hope we have been able to provide you high quality care today.  You may receive a patient satisfaction survey over the next few weeks. We would appreciate your feedback and comments so that we may continue to improve ourselves and the health of our patients.

## 2018-01-22 NOTE — Progress Notes (Signed)
I have read the note, and I agree with the clinical assessment and plan.  Keisha Amer K Ruqayyah Lute   

## 2018-01-23 LAB — COMPREHENSIVE METABOLIC PANEL
ALBUMIN: 3.9 g/dL (ref 3.5–5.5)
ALK PHOS: 90 IU/L (ref 39–117)
ALT: 17 IU/L (ref 0–32)
AST: 19 IU/L (ref 0–40)
Albumin/Globulin Ratio: 1.4 (ref 1.2–2.2)
BUN/Creatinine Ratio: 14 (ref 9–23)
BUN: 11 mg/dL (ref 6–24)
Bilirubin Total: <0.2 mg/dL (ref 0.0–1.2)
CHLORIDE: 107 mmol/L — AB (ref 96–106)
CO2: 20 mmol/L (ref 20–29)
Calcium: 8.4 mg/dL — ABNORMAL LOW (ref 8.7–10.2)
Creatinine, Ser: 0.76 mg/dL (ref 0.57–1.00)
GFR calc non Af Amer: 93 mL/min/{1.73_m2} (ref >59)
GFR, EST AFRICAN AMERICAN: 107 mL/min/{1.73_m2} (ref >59)
GLOBULIN, TOTAL: 2.8 g/dL (ref 1.5–4.5)
Glucose: 78 mg/dL (ref 65–99)
Potassium: 4.1 mmol/L (ref 3.5–5.2)
SODIUM: 142 mmol/L (ref 134–144)
TOTAL PROTEIN: 6.7 g/dL (ref 6.0–8.5)

## 2018-01-23 LAB — CBC WITH DIFFERENTIAL/PLATELET
BASOS ABS: 0 10*3/uL (ref 0.0–0.2)
Basos: 1 %
EOS (ABSOLUTE): 0.2 10*3/uL (ref 0.0–0.4)
Eos: 3 %
Hematocrit: 37.5 % (ref 34.0–46.6)
Hemoglobin: 12.2 g/dL (ref 11.1–15.9)
Immature Grans (Abs): 0 10*3/uL (ref 0.0–0.1)
Immature Granulocytes: 0 %
LYMPHS ABS: 1.2 10*3/uL (ref 0.7–3.1)
Lymphs: 21 %
MCH: 28.6 pg (ref 26.6–33.0)
MCHC: 32.5 g/dL (ref 31.5–35.7)
MCV: 88 fL (ref 79–97)
MONOS ABS: 0.6 10*3/uL (ref 0.1–0.9)
Monocytes: 10 %
Neutrophils Absolute: 3.7 10*3/uL (ref 1.4–7.0)
Neutrophils: 65 %
Platelets: 292 10*3/uL (ref 150–450)
RBC: 4.26 x10E6/uL (ref 3.77–5.28)
RDW: 13.1 % (ref 12.3–15.4)
WBC: 5.7 10*3/uL (ref 3.4–10.8)

## 2018-01-23 LAB — CARBAMAZEPINE LEVEL, TOTAL: CARBAMAZEPINE LVL: 11.9 ug/mL (ref 4.0–12.0)

## 2018-01-24 ENCOUNTER — Telehealth: Payer: Self-pay | Admitting: Adult Health

## 2018-01-24 NOTE — Telephone Encounter (Signed)
Jonathan/[facility] [phone] needs clarification on dosing for carbamazipine. Pt was on 300mg , there was a manufacture change, he said after requesting auth to fill it a new script was sent in for 200mg . Please call to advise

## 2018-01-24 NOTE — Telephone Encounter (Signed)
Per 01/22/18 ov note, pt. is taking Carbamazepine 500mg  qam and 600mg  qhs. New rx's for both the 300mg  and 100mg  were escribed on 01/22/18.  I have spoken with the pharmacist and relayed this information/fim

## 2018-01-25 ENCOUNTER — Telehealth: Payer: Self-pay | Admitting: *Deleted

## 2018-01-25 NOTE — Telephone Encounter (Signed)
Spoke with Anna Pugh and per MM, advised lab work done in our office was ok.  She verbalized understanding of same/fim

## 2018-05-23 IMAGING — DX DG CHEST 2V
2 series · 2 of 2 positions shown · non-contrast
Comparison: None.

CLINICAL DATA: Cough which is not improving with antibiotic
treatment.

EXAM:
CHEST  2 VIEW

[chest pa]
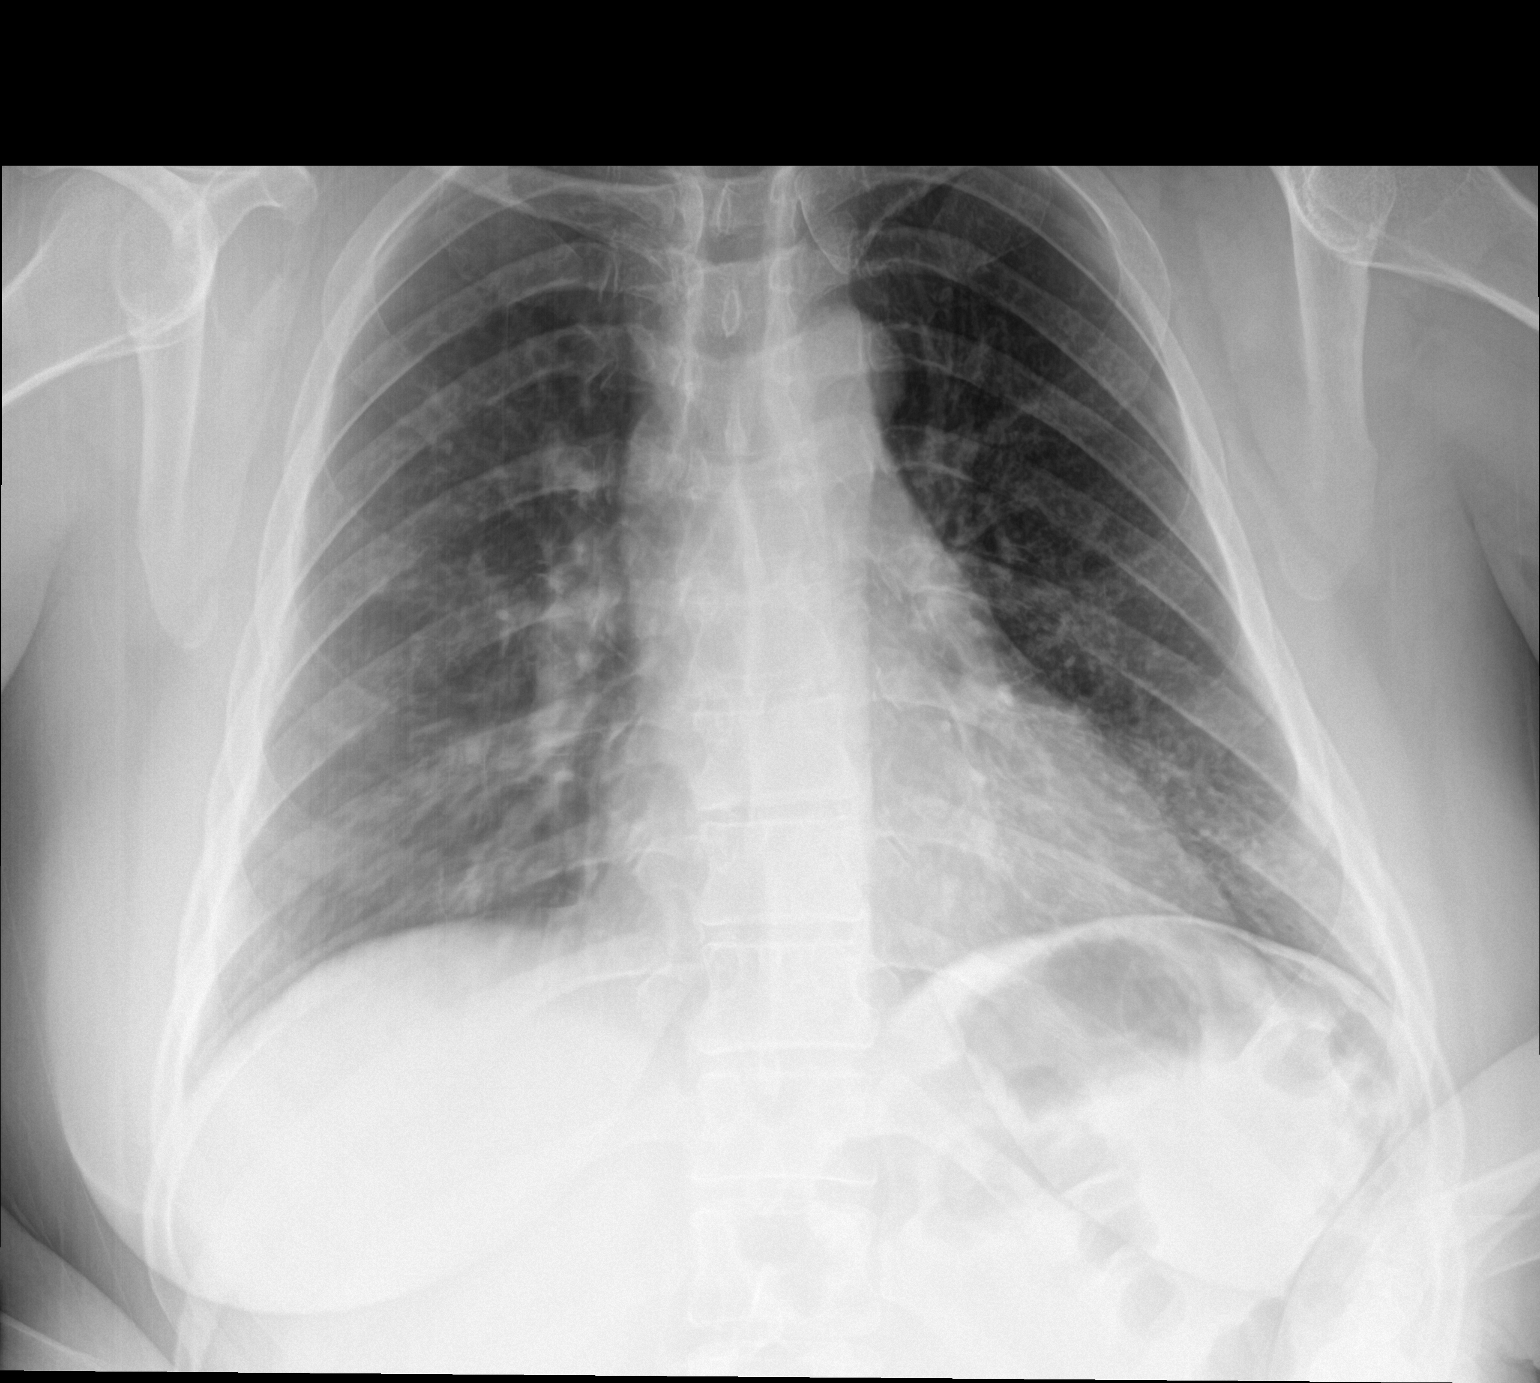

[chest lat]
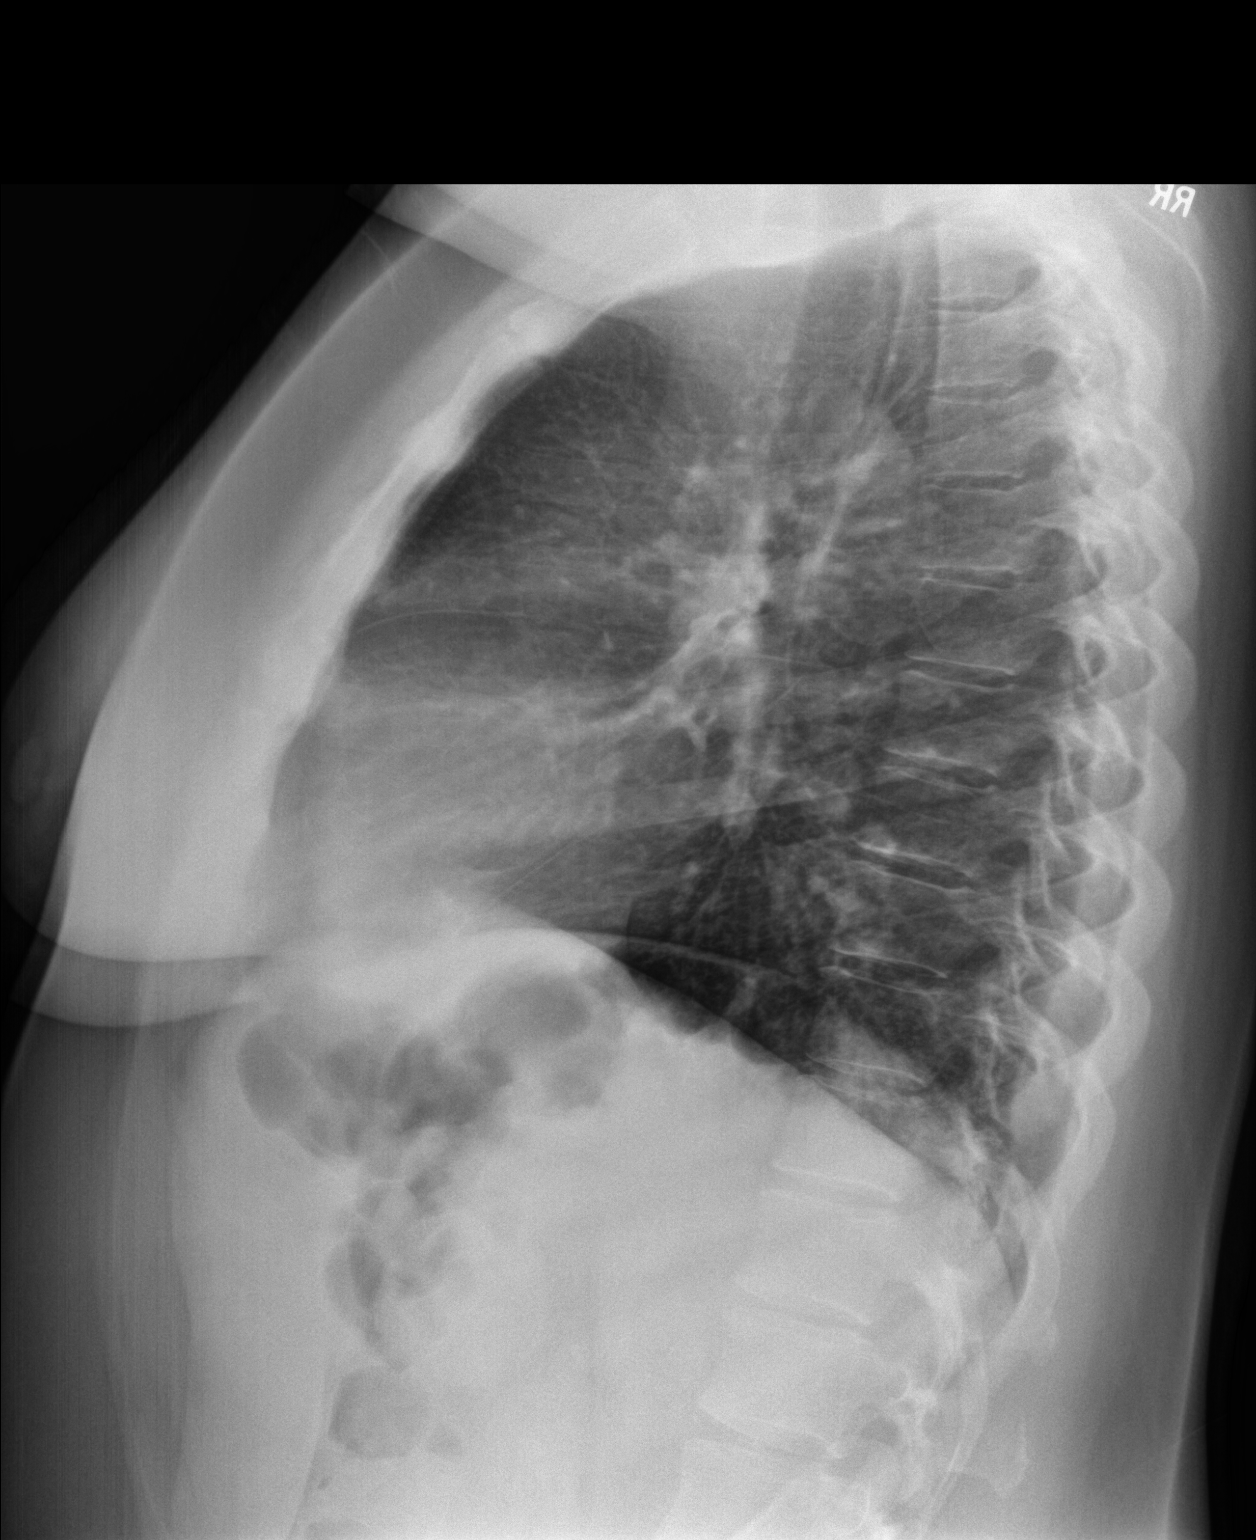

[2 of 2 positions shown; findings below may reference images not displayed]

FINDINGS: Frontal view is degraded by motion. Heart size is normal.
Mediastinal shadows are normal. I think there is a pattern of
bronchial thickening most consistent with bronchitis. No evidence of
consolidation or collapse. No effusions. No bone abnormality.
IMPRESSION: Motion degraded frontal view. Suspicion of bronchitis pattern. No
consolidation or collapse identified.

## 2018-06-19 IMAGING — DX DG KNEE COMPLETE 4+V*L*
4 series · 4 of 4 positions shown · non-contrast
Comparison: He no priors.

CLINICAL DATA: 46-year-old female with history of left-sided knee
pain and swelling in the lateral aspect of the left patella.

EXAM:
LEFT KNEE - COMPLETE 4+ VIEW

[knee ap]
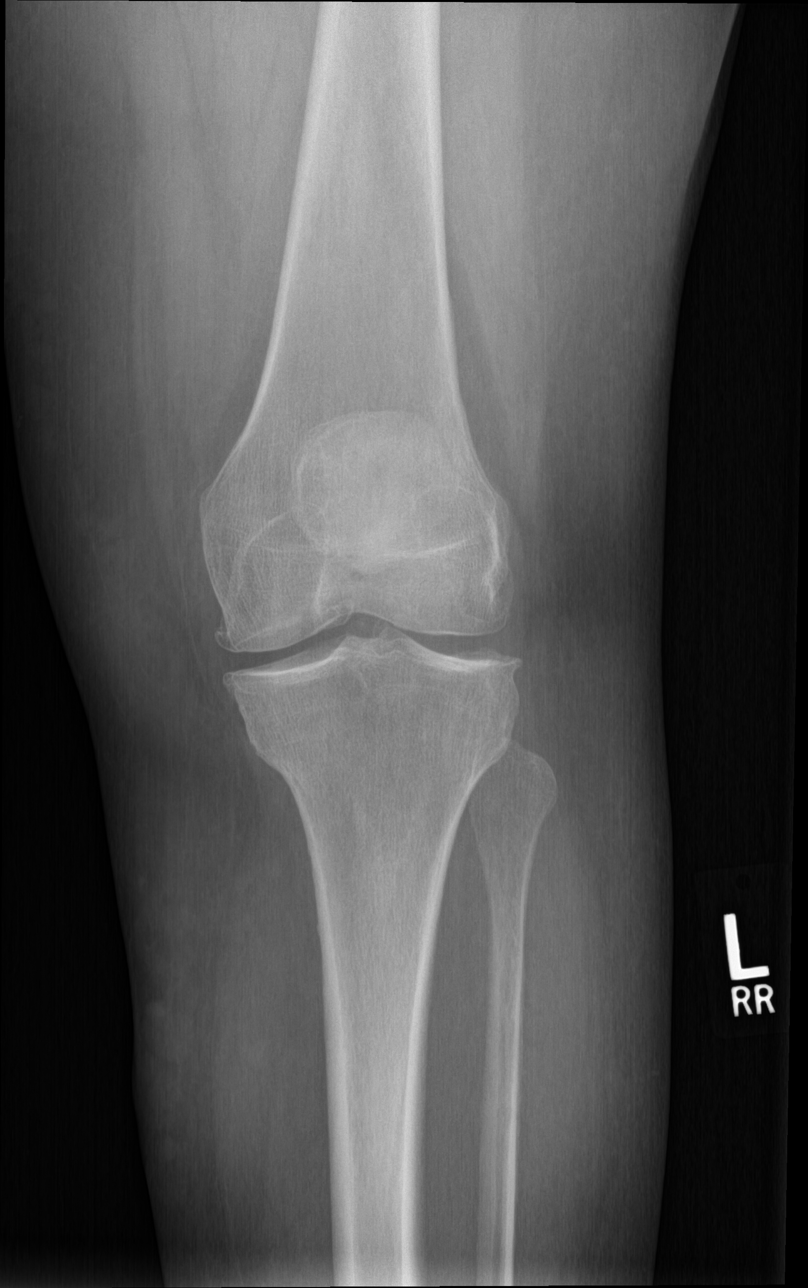

[knee obl (1 of 2)]
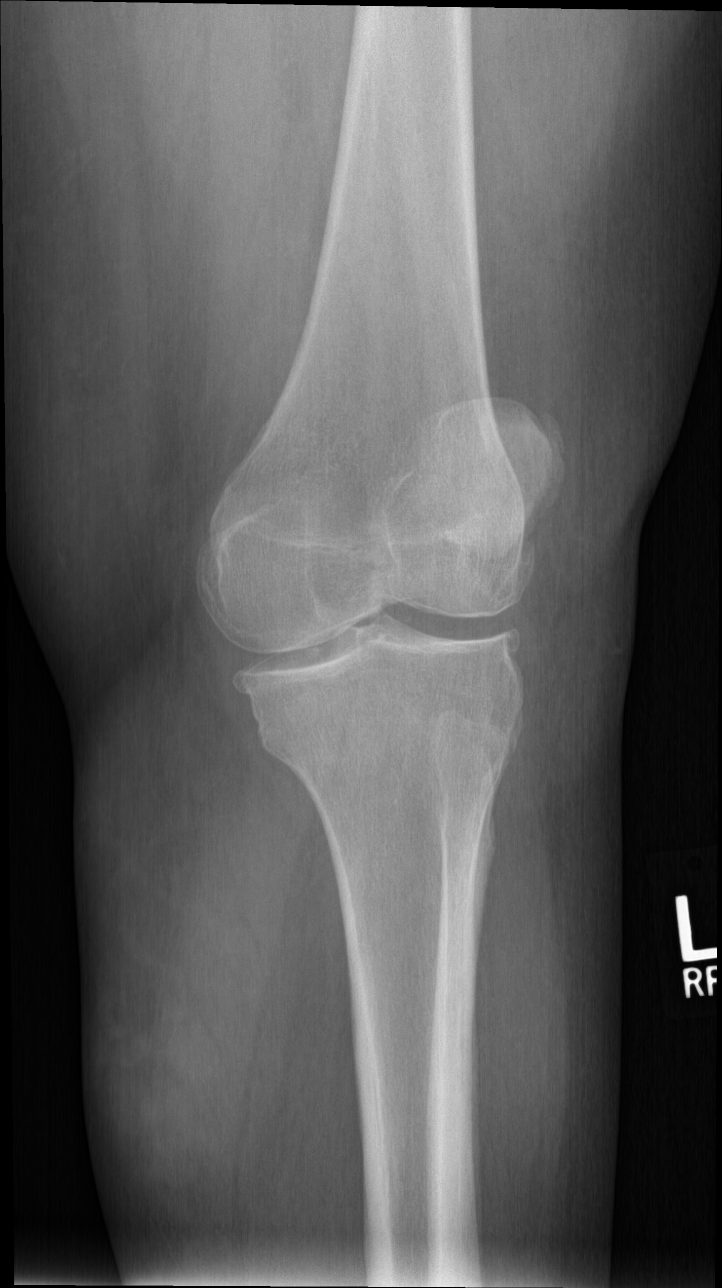

[knee obl (2 of 2)]
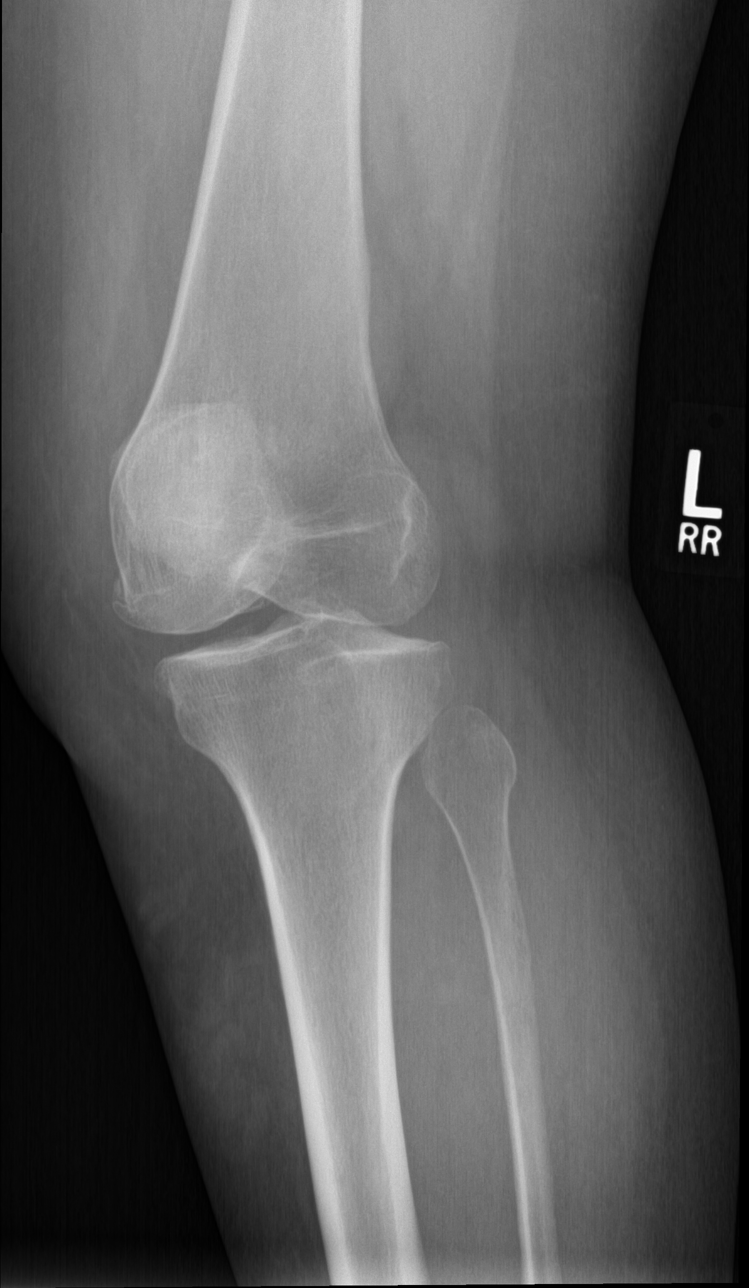

[knee lat]
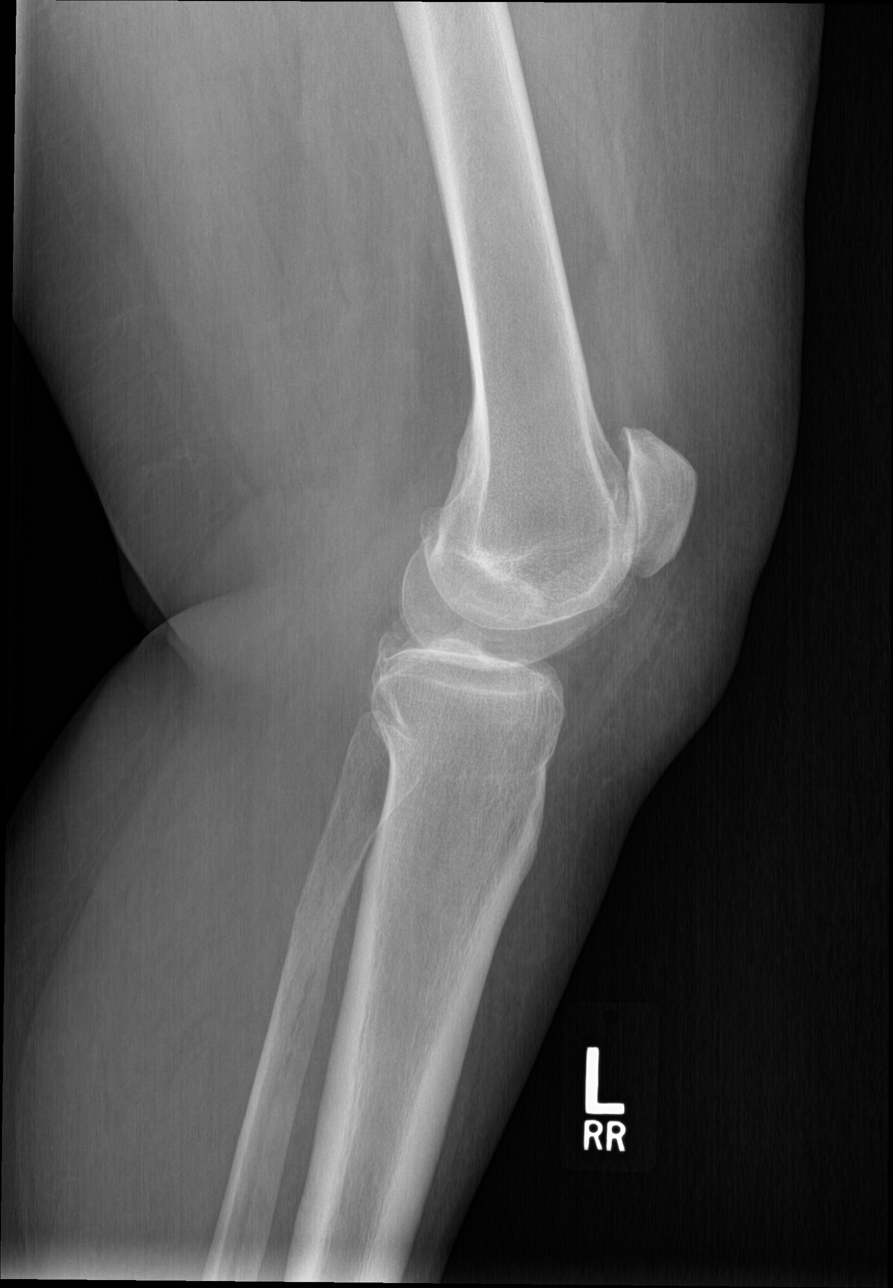

[4 of 4 positions shown; findings below may reference images not displayed]

FINDINGS: No evidence of fracture, dislocation, or joint effusion. Joint space
narrowing, subchondral sclerosis, subchondral cyst formation and
osteophyte formation noted in a tricompartmental distribution,
compatible with moderate to severe osteoarthritis. Soft tissues are
unremarkable.
IMPRESSION: 1. No acute radiographic abnormality of the left knee.
2. Moderate to severe tricompartmental osteoarthritis.

## 2018-10-09 ENCOUNTER — Telehealth (INDEPENDENT_AMBULATORY_CARE_PROVIDER_SITE_OTHER): Payer: 59 | Admitting: Family Medicine

## 2018-10-09 ENCOUNTER — Telehealth: Payer: Self-pay | Admitting: Family Medicine

## 2018-10-09 ENCOUNTER — Ambulatory Visit: Payer: Self-pay

## 2018-10-09 ENCOUNTER — Telehealth: Payer: Self-pay | Admitting: *Deleted

## 2018-10-09 ENCOUNTER — Other Ambulatory Visit: Payer: Self-pay

## 2018-10-09 DIAGNOSIS — Z7189 Other specified counseling: Secondary | ICD-10-CM | POA: Diagnosis not present

## 2018-10-09 DIAGNOSIS — R5383 Other fatigue: Secondary | ICD-10-CM

## 2018-10-09 DIAGNOSIS — Z20822 Contact with and (suspected) exposure to covid-19: Secondary | ICD-10-CM

## 2018-10-09 NOTE — Telephone Encounter (Signed)
Patient called and scheduled for COVID-19 testing at the [facility] location on 10/10/18. Pt advised to wear a mask and to remain in car at appt time. Understanding verbalized.

## 2018-10-09 NOTE — Telephone Encounter (Signed)
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

## 2018-10-09 NOTE — Telephone Encounter (Signed)
Pt called and scheduled for testing at [facility] site on 10/10/18.

## 2018-10-09 NOTE — Progress Notes (Signed)
Pt states she has ben having some sore throat,chills,fatigue, and some headache x 2 days. Pt states she has been checking her tempeture and she has not been having a fever.

## 2018-10-09 NOTE — Progress Notes (Signed)
Telemedicine Encounter- SOAP NOTE Established Patient  I discussed the limitations, risks, security and privacy concerns of performing an evaluation and management service by telephone and the availability of in person appointments. I also discussed with the patient that there may be a patient responsible charge related to this service. The patient expressed understanding and agreed to proceed.  This telephone encounter was conducted with the patient's verbal consent via audio telecommunications: yes Patient was instructed to have this encounter in a suitably private space; and to only have persons present to whom they give permission to participate. In addition, patient identity was confirmed by use of name plus two identifiers (DOB and address).  I spent a total of 15min talking with the patient   Pt states she has ben having some sore throat,chills,fatigue, and some headache x 2 days. Pt states she has been checking her tempeture and she has not been having a fever.            Subjective   Anna Pugh is a 48 y.o. female established patient. Telephone visit today for concern for COVID  HPI Pt with congestion and sore throat overnight. Pt states she thought-getting a cold or allergies.  Pt works with pt with disabilities. Pt off work during the COVID  And due to return. Pt with cold/hot sensation-not as severe-occasional chills. Sharp pain in the head. Dull pain. Pt with fatigue.  Pt with no cough, no SOB.  "biggest problem is fatigue"  No temperature-highest 98.7.  Bronchitis for 2 weeks with fever.  No n/v/d  No abnormal body aches.  No loss of appetite.  No migraines and no recent seizure.  No change in activities.  Wearing a mask for all outside activities..    Patient Active Problem List   Diagnosis Date Noted  . Partial epilepsy with impairment of consciousness (HCC) 01/04/2013  . Migraine without aura 01/04/2013    Past Medical History:  Diagnosis Date  . GERD  (gastroesophageal reflux disease)   . Localization-related (focal) (partial) epilepsy and epileptic syndromes with complex partial seizures, without mention of intractable epilepsy 01/04/2013  . Memory deficit   . Migraine without aura, without mention of intractable migraine without mention of status migrainosus 01/04/2013  . Obesity   . Seizures (HCC)     Current Outpatient Medications  Medication Sig Dispense Refill  . carbamazepine ([name]) 200 MG 12 hr capsule Take by mouth.    . carbamazepine (TEGRETOL-XR) 100 MG 12 hr tablet Take 2 tablets (200 mg total) by mouth every morning. 180 tablet 3  . cholecalciferol (VITAMIN D) 1000 units tablet Take 1,000 Units by mouth daily.    . rizatriptan (MAXALT) 10 MG tablet Take 1 tablet (10 mg total) by mouth 3 (three) times daily as needed for migraine. May repeat in 2 hours if needed 12 tablet 3   No current facility-administered medications for this visit.     Allergies  Allergen Reactions  . Keppra [Levetiracetam]   . Lamictal [Lamotrigine]   . Topamax [Topiramate]   . Azithromycin Other (See Comments)    Disoriented and out of breath    Social History   Socioeconomic History  . Marital status: Single    Spouse name: Not on file  . Number of children: 0  . Years of education: Bachelor  . Highest education level: Not on file  Occupational History    Comment: Employed by [employer]  Social Needs  . Financial resource strain: Not on file  .  Food insecurity:    Worry: Not on file    Inability: Not on file  . Transportation needs:    Medical: Not on file    Non-medical: Not on file  Tobacco Use  . Smoking status: Never Smoker  . Smokeless tobacco: Never Used  Substance and Sexual Activity  . Alcohol use: No  . Drug use: No  . Sexual activity: Not on file  Lifestyle  . Physical activity:    Days per week: Not on file    Minutes per session: Not on file  . Stress: Not on file  Relationships  . Social connections:     Talks on phone: Not on file    Gets together: Not on file    Attends religious service: Not on file    Active member of club or organization: Not on file    Attends meetings of clubs or organizations: Not on file    Relationship status: Not on file  . Intimate partner violence:    Fear of current or ex partner: Not on file    Emotionally abused: Not on file    Physically abused: Not on file    Forced sexual activity: Not on file  Other Topics Concern  . Not on file  Social History Narrative   Patient is single with no children.   Patient is left handed.   Patient has a Bachelor's degree.   Patient drinks 3 cups daily.    Review of Systems  Constitutional: Positive for chills and malaise/fatigue. Negative for fever.  HENT: Positive for congestion.   Respiratory: Negative for cough and shortness of breath.   Cardiovascular: Negative for chest pain.  Gastrointestinal: Negative.   Genitourinary: Negative.   Skin: Negative for rash.  Neurological: Positive for weakness.    Objective   Vitals as reported by the patient: none 1. 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 Fatigue, unspecified type Sudden onset with chills, sore throat-no FEVER-temp normal at home, no cough, no SOB, no rash, no diarrhea No h/o exposure to COVID  I discussed the assessment and treatment plan with the patient. The patient was provided an opportunity to ask questions and all were answered. The patient agreed with the plan and demonstrated an understanding of the instructions.   The patient was advised to call back or seek an in-person evaluation if the symptoms worsen or if the condition fails to improve as anticipated.  I provided 15 minutes of non-face-to-face time during this encounter.   Mat Carne, MD  Primary Care at [facility] 10-09-18

## 2018-10-09 NOTE — Telephone Encounter (Signed)
Pt. Reports she started feeling bad yesterday with sniffles and sore throat. Today she is weak, feels hot and cold, headache. Concerned about COVID 19. Warm transfer to [place] in the practice for a virtual visit.  Answer Assessment - Initial Assessment Questions 1. COVID-19 DIAGNOSIS: "Who made your Coronavirus (COVID-19) diagnosis?" "Was it confirmed by a positive lab test?" If not diagnosed by a HCP, ask "Are there lots of cases (community spread) where you live?" (See public health department website, if unsure)   * MAJOR community spread: high number of cases; numbers of cases are increasing; many people hospitalized.   * MINOR community spread: low number of cases; not increasing; few or no people hospitalized     No test 2. ONSET: "When did the COVID-19 symptoms start?"      Started yesterday 3. WORST SYMPTOM: "What is your worst symptom?" (e.g., cough, fever, shortness of breath, muscle aches)     Weakness, sore throat, headache 4. COUGH: "Do you have a cough?" If so, ask: "How bad is the cough?"       Dry cough 5. FEVER: "Do you have a fever?" If so, ask: "What is your temperature, how was it measured, and when did it start?"     No 6. RESPIRATORY STATUS: "Describe your breathing?" (e.g., shortness of breath, wheezing, unable to speak)      No 7. BETTER-SAME-WORSE: "Are you getting better, staying the same or getting worse compared to yesterday?"  If getting worse, ask, "In what way?"     Worse 8. HIGH RISK DISEASE: "Do you have any chronic medical problems?" (e.g., asthma, heart or lung disease, weak immune system, etc.)     No 9. PREGNANCY: "Is there any chance you are pregnant?" "When was your last menstrual period?"     No 10. OTHER SYMPTOMS: "Do you have any other symptoms?"  (e.g., runny nose, headache, sore throat, loss of smell)       Sniffles, Headache, sore throat  Protocols used: CORONAVIRUS (COVID-19) DIAGNOSED OR SUSPECTED-A-AH

## 2018-10-10 ENCOUNTER — Other Ambulatory Visit: Payer: Self-pay

## 2018-10-10 DIAGNOSIS — Z20822 Contact with and (suspected) exposure to covid-19: Secondary | ICD-10-CM

## 2018-10-12 ENCOUNTER — Telehealth: Payer: Self-pay | Admitting: Family Medicine

## 2018-10-12 LAB — NOVEL CORONAVIRUS, NAA: SARS-CoV-2, NAA: NOT DETECTED

## 2018-10-12 NOTE — Telephone Encounter (Signed)
Copied from CRM [id number]. Topic: General - Inquiry >> Oct 12, 2018  9:27 AM Deborha Payment wrote: Reason for CRM: Patient had a missed call from [place] office, cannot figure out who gave a call. She did get covid test back she thinks maybe tried to tell her to go over results.  Patient would also like to est care with Dorette Grate, MD. Patient call back # [phone]

## 2018-10-15 NOTE — Telephone Encounter (Signed)
Pt has been informed of results

## 2019-01-02 ENCOUNTER — Encounter: Payer: Self-pay | Admitting: Adult Health

## 2019-01-08 NOTE — Progress Notes (Signed)
PATIENT: Anna Pugh DOB: 05/10/1971  REASON FOR VISIT: follow up HISTORY FROM: patient  Chief Complaint  Patient presents with   Follow-up    Yearly f/u. Alone. New room. No new concerns at this time.      HISTORY OF PRESENT ILLNESS: Today 01/09/19 Anna Pugh is a 48 y.o. female here today for follow up. She has been taking 1 capsule of [name] 200mg  and 3 tablets of Tegretol XR 100mg  for a total of 500mg  in the am and 3 capsules of [name] in the evenings.  She has not had any seizure activity or migraines in the past year. She is living alone and able to perform ADL's. She is working full time. She is able to drive without difficulty. She is feeling well today.   HISTORY: (copied from [place] P[place]'s note on 01/22/2018)  Ms. Anna Pugh is a 48 year old female with a history of seizures and migraine headaches.  She returns today for follow-up.  She reports that she has been doing very well.  Not had any seizure events and no migraines in the last year.  She reports that her current dose of [name] is working well for her.  She is able to complete all ADLs independently.  She operates a Librarian, academicmotor vehicle without difficulty.  Denies any changes in her gait or balance.  She returns today for evaluation.  HISTORY Ms.Steineris a 48 year old left-handed white female with a history of seizures and migraine headaches. The patient has done quite well with both issues, she has not had any migraine headaches since last seen one year ago, she has not had any recurring seizures. She is on maximum doses of [name], she is tolerating the medication well. She denies drowsiness or gait instability. She is not on vitamin D supplementation. She returns for routine reevaluation. No other significant medical issues have come up since last seen.   REVIEW OF SYSTEMS: Out of a complete 14 system review of symptoms, the patient complains only of the following symptoms, environmental allergies and all other  reviewed systems are negative.  ALLERGIES: Allergies  Allergen Reactions   Keppra [Levetiracetam]    Lamictal [Lamotrigine]    Topamax [Topiramate]    Azithromycin Other (See Comments)    Disoriented and out of breath    HOME MEDICATIONS: Outpatient Medications Prior to Visit  Medication Sig Dispense Refill   carbamazepine ([name]) 200 MG 12 hr capsule Take 600 mg by mouth every evening.      cholecalciferol (VITAMIN D) 1000 units tablet Take 1,000 Units by mouth daily.     rizatriptan (MAXALT) 10 MG tablet Take 1 tablet (10 mg total) by mouth 3 (three) times daily as needed for migraine. May repeat in 2 hours if needed 12 tablet 3   carbamazepine (TEGRETOL-XR) 100 MG 12 hr tablet Take 2 tablets (200 mg total) by mouth every morning. 180 tablet 3   No facility-administered medications prior to visit.     PAST MEDICAL HISTORY: Past Medical History:  Diagnosis Date   GERD (gastroesophageal reflux disease)    Localization-related (focal) (partial) epilepsy and epileptic syndromes with complex partial seizures, without mention of intractable epilepsy 01/04/2013   Memory deficit    Migraine without aura, without mention of intractable migraine without mention of status migrainosus 01/04/2013   Obesity    Seizures (HCC)     PAST SURGICAL HISTORY: Past Surgical History:  Procedure Laterality Date   TONSILLECTOMY      FAMILY HISTORY: Family History  Problem Relation Age  of Onset   Migraines Father    Myasthenia gravis Father    Cancer Brother     SOCIAL HISTORY: Social History   Socioeconomic History   Marital status: Single    Spouse name: Not on file   Number of children: 0   Years of education: Bachelor   Highest education level: Not on file  Occupational History    Comment: Employed by [employer]  Social Needs   Financial resource strain: Not on file   Food insecurity    Worry: Not on file    Inability: Not on file   Transportation  needs    Medical: Not on file    Non-medical: Not on file  Tobacco Use   Smoking status: Never Smoker   Smokeless tobacco: Never Used  Substance and Sexual Activity   Alcohol use: No   Drug use: No   Sexual activity: Not on file  Lifestyle   Physical activity    Days per week: Not on file    Minutes per session: Not on file   Stress: Not on file  Relationships   Social connections    Talks on phone: Not on file    Gets together: Not on file    Attends religious service: Not on file    Active member of club or organization: Not on file    Attends meetings of clubs or organizations: Not on file    Relationship status: Not on file   Intimate partner violence    Fear of current or ex partner: Not on file    Emotionally abused: Not on file    Physically abused: Not on file    Forced sexual activity: Not on file  Other Topics Concern   Not on file  Social History Narrative   Patient is single with no children.   Patient is left handed.   Patient has a Bachelor's degree.   Patient drinks 3 cups daily.      PHYSICAL EXAM  Vitals:   01/09/19 0754  BP: 99/64  Pulse: 66  Temp: 97.7 F (36.5 C)  TempSrc: Oral  Weight: 218 lb 6.4 oz (99.1 kg)  Height: 5' 3.5" (1.613 m)   Body mass index is 38.08 kg/m.  Generalized: Well developed, in no acute distress  Cardiology: normal rate and rhythm, no murmur noted Neurological examination  Mentation: Alert oriented to time, place, history taking. Follows all commands speech and language fluent Cranial nerve II-XII: Pupils were equal round reactive to light. Extraocular movements were full, visual field were full on confrontational test. Facial sensation and strength were normal. Uvula tongue midline. Head turning and shoulder shrug  were normal and symmetric. Motor: The motor testing reveals 5 over 5 strength of all 4 extremities. Good symmetric motor tone is noted throughout.  Sensory: Sensory testing is intact to soft  touch on all 4 extremities. No evidence of extinction is noted.  Coordination: Cerebellar testing reveals good finger-nose-finger and heel-to-shin bilaterally.  Gait and station: Gait is normal.   DIAGNOSTIC DATA (LABS, IMAGING, TESTING) - I reviewed patient records, labs, notes, testing and imaging myself where available.  No flowsheet data found.   Lab Results  Component Value Date   WBC 5.7 01/22/2018   HGB 12.2 01/22/2018   HCT 37.5 01/22/2018   MCV 88 01/22/2018   PLT 292 01/22/2018      Component Value Date/Time   NA 142 01/22/2018 0806   K 4.1 01/22/2018 0806   CL 107 (H)  01/22/2018 0806   CO2 20 01/22/2018 0806   GLUCOSE 78 01/22/2018 0806   BUN 11 01/22/2018 0806   CREATININE 0.76 01/22/2018 0806   CALCIUM 8.4 (L) 01/22/2018 0806   PROT 6.7 01/22/2018 0806   ALBUMIN 3.9 01/22/2018 0806   AST 19 01/22/2018 0806   ALT 17 01/22/2018 0806   ALKPHOS 90 01/22/2018 0806   BILITOT <0.2 01/22/2018 0806   GFRNONAA 93 01/22/2018 0806   GFRAA 107 01/22/2018 0806   No results found for: CHOL, HDL, LDLCALC, LDLDIRECT, TRIG, CHOLHDL No results found for: HGBA1C Lab Results  Component Value Date   VITAMINB12 447 01/04/2013   Lab Results  Component Value Date   TSH 2.790 01/04/2013     ASSESSMENT AND PLAN 48 y.o. year old female  has a past medical history of GERD (gastroesophageal reflux disease), Localization-related (focal) (partial) epilepsy and epileptic syndromes with complex partial seizures, without mention of intractable epilepsy (01/04/2013), Memory deficit, Migraine without aura, without mention of intractable migraine without mention of status migrainosus (01/04/2013), Obesity, and Seizures ([place]). here with     ICD-10-CM   1. Migraine without aura and without status migrainosus, not intractable  G43.009   2. Partial epilepsy with impairment of consciousness (HCC)  G40.209 CMP    CBC with Differential/Platelets    Carbamazepine level, total    Gabby is doing  very well on her current dose of carbamazepine.  We have reviewed correct dosage.  She will continue 500 mg in the a.m. and 600 mg in the p.m.  Prescription dosage corrected today in the office.  I have given her seizure precautions.  Adequate hydration, well-balanced diet and regular activity advised.  We will update labs today.  She will follow-up with me in 1 year.  She verbalizes understanding and agreement with this plan.   Orders Placed This Encounter  Procedures   CMP   CBC with Differential/Platelets   Carbamazepine level, total     Meds ordered this encounter  Medications   carbamazepine ([name]) 200 MG 12 hr capsule    Sig: Take 1 capsule (200mg ) in the am and 3 capsules (600mg ) in the evening    Dispense:  360 capsule    Refill:  3    Order Specific Question:   Supervising Provider    Answer:   Melvenia Beam [[id number]]   carbamazepine (TEGRETOL-XR) 100 MG 12 hr tablet    Sig: Take 2 tablets (200 mg total) by mouth every morning.    Dispense:  180 tablet    Refill:  3    Order Specific Question:   Supervising Provider    Answer:   Melvenia Beam [id number]      I spent 15 minutes with the patient. 50% of this time was spent counseling and educating patient on plan of care and medications.    Adalyne Lovick, FNP-C 01/09/2019, 8:42 AM [facility] [address] [place], [place] [address] [phone]

## 2019-01-09 ENCOUNTER — Encounter: Payer: Self-pay | Admitting: Family Medicine

## 2019-01-09 ENCOUNTER — Other Ambulatory Visit: Payer: Self-pay

## 2019-01-09 ENCOUNTER — Ambulatory Visit: Payer: 59 | Admitting: Family Medicine

## 2019-01-09 VITALS — BP 99/64 | HR 66 | Temp 97.7°F | Ht 63.5 in | Wt 218.4 lb

## 2019-01-09 DIAGNOSIS — G43009 Migraine without aura, not intractable, without status migrainosus: Secondary | ICD-10-CM | POA: Diagnosis not present

## 2019-01-09 DIAGNOSIS — G40209 Localization-related (focal) (partial) symptomatic epilepsy and epileptic syndromes with complex partial seizures, not intractable, without status epilepticus: Secondary | ICD-10-CM

## 2019-01-09 MED ORDER — CARBAMAZEPINE ER 200 MG PO CP12: ORAL_CAPSULE | ORAL | 3 refills | Status: DC

## 2019-01-09 MED ORDER — CARBAMAZEPINE ER 100 MG PO TB12: 200.0000 mg | ORAL_TABLET | ORAL | 3 refills | Status: DC

## 2019-01-09 NOTE — Progress Notes (Signed)
I have read the note, and I agree with the clinical assessment and plan.  Anna Pugh   

## 2019-01-09 NOTE — Patient Instructions (Signed)
Continue [name] as prescribed   Follow up in 1 year  Migraine Headache A migraine headache is a very strong throbbing pain on one side or both sides of your head. This type of headache can also cause other symptoms. It can last from 4 hours to 3 days. Talk with your doctor about what things may bring on (trigger) this condition. What are the causes? The exact cause of this condition is not known. This condition may be triggered or caused by:  Drinking alcohol.  Smoking.  Taking medicines, such as: ? Medicine used to treat chest pain (nitroglycerin). ? Birth control pills. ? Estrogen. ? Some blood pressure medicines.  Eating or drinking certain products.  Doing physical activity. Other things that may trigger a migraine headache include:  Having a menstrual period.  Pregnancy.  Hunger.  Stress.  Not getting enough sleep or getting too much sleep.  Weather changes.  Tiredness (fatigue). What increases the risk?  Being 5-73 years old.  Being female.  Having a family history of migraine headaches.  Being Caucasian.  Having depression or anxiety.  Being very overweight. What are the signs or symptoms?  A throbbing pain. This pain may: ? Happen in any area of the head, such as on one side or both sides. ? Make it hard to do daily activities. ? Get worse with physical activity. ? Get worse around bright lights or loud noises.  Other symptoms may include: ? Feeling sick to your stomach (nauseous). ? Vomiting. ? Dizziness. ? Being sensitive to bright lights, loud noises, or smells.  Before you get a migraine headache, you may get warning signs (an aura). An aura may include: ? Seeing flashing lights or having blind spots. ? Seeing bright spots, halos, or zigzag lines. ? Having tunnel vision or blurred vision. ? Having numbness or a tingling feeling. ? Having trouble talking. ? Having weak muscles.  Some people have symptoms after a migraine headache  (postdromal phase), such as: ? Tiredness. ? Trouble thinking (concentrating). How is this treated?  Taking medicines that: ? Relieve pain. ? Relieve the feeling of being sick to your stomach. ? Prevent migraine headaches.  Treatment may also include: ? Having acupuncture. ? Avoiding foods that bring on migraine headaches. ? Learning ways to control your body functions (biofeedback). ? Therapy to help you know and deal with negative thoughts (cognitive behavioral therapy). Follow these instructions at home: Medicines  Take over-the-counter and prescription medicines only as told by your doctor.  Ask your doctor if the medicine prescribed to you: ? Requires you to avoid driving or using heavy machinery. ? Can cause trouble pooping (constipation). You may need to take these steps to prevent or treat trouble pooping:  Drink enough fluid to keep your pee (urine) pale yellow.  Take over-the-counter or prescription medicines.  Eat foods that are high in fiber. These include beans, whole grains, and fresh fruits and vegetables.  Limit foods that are high in fat and sugar. These include fried or sweet foods. Lifestyle  Do not drink alcohol.  Do not use any products that contain nicotine or tobacco, such as cigarettes, e-cigarettes, and chewing tobacco. If you need help quitting, ask your doctor.  Get at least 8 hours of sleep every night.  Limit and deal with stress. General instructions      Keep a journal to find out what may bring on your migraine headaches. For example, write down: ? What you eat and drink. ? How much sleep you  get. ? Any change in what you eat or drink. ? Any change in your medicines.  If you have a migraine headache: ? Avoid things that make your symptoms worse, such as bright lights. ? It may help to lie down in a dark, quiet room. ? Do not drive or use heavy machinery. ? Ask your doctor what activities are safe for you.  Keep all follow-up  visits as told by your doctor. This is important. Contact a doctor if:  You get a migraine headache that is different or worse than others you have had.  You have more than 15 headache days in one month. Get help right away if:  Your migraine headache gets very bad.  Your migraine headache lasts longer than 72 hours.  You have a fever.  You have a stiff neck.  You have trouble seeing.  Your muscles feel weak or like you cannot control them.  You start to lose your balance a lot.  You start to have trouble walking.  You pass out (faint).  You have a seizure. Summary  A migraine headache is a very strong throbbing pain on one side or both sides of your head. These headaches can also cause other symptoms.  This condition may be treated with medicines and changes to your lifestyle.  Keep a journal to find out what may bring on your migraine headaches.  Contact a doctor if you get a migraine headache that is different or worse than others you have had.  Contact your doctor if you have more than 15 headache days in a month. This information is not intended to replace advice given to you by your health care provider. Make sure you discuss any questions you have with your health care provider. Document Released: 02/09/2008 Document Revised: 08/24/2018 Document Reviewed: 06/14/2018 Elsevier Patient Education  [place]. Seizure, Adult A seizure is a sudden burst of abnormal electrical activity in the brain. Seizures usually last from 30 seconds to 2 minutes. They can cause many different symptoms. Usually, seizures are not harmful unless they last a long time. What are the causes? Common causes of this condition include:  Fever or infection.  Conditions that affect the brain, such as: ? A brain abnormality that you were born with. ? A brain or head injury. ? Bleeding in the brain. ? A tumor. ? Stroke. ? Brain disorders such as autism or cerebral palsy.  Low  blood sugar.  Conditions that are passed from parent to child (are inherited).  Problems with substances, such as: ? Having a reaction to a drug or a medicine. ? Suddenly stopping the use of a substance (withdrawal). In some cases, the cause may not be known. A person who has repeated seizures over time without a clear cause has a condition called epilepsy. What increases the risk? You are more likely to get this condition if you have:  A family history of epilepsy.  Had a seizure in the past.  A brain disorder.  A history of head injury, lack of oxygen at birth, or strokes. What are the signs or symptoms? There are many types of seizures. The symptoms vary depending on the type of seizure you have. Examples of symptoms during a seizure include:  Shaking (convulsions).  Stiffness in the body.  Passing out (losing consciousness).  Head nodding.  Staring.  Not responding to sound or touch.  Loss of bladder control and bowel control. Some people have symptoms right before and right after a  seizure happens. Symptoms before a seizure may include:  Fear.  Worry (anxiety).  Feeling like you may vomit (nauseous).  Feeling like the room is spinning (vertigo).  Feeling like you saw or heard something before (dj vu).  Odd tastes or smells.  Changes in how you see. You may see flashing lights or spots. Symptoms after a seizure happens can include:  Confusion.  Sleepiness.  Headache.  Weakness on one side of the body. How is this treated? Most seizures will stop on their own in under 5 minutes. In these cases, no treatment is needed. Seizures that last longer than 5 minutes will usually need treatment. Treatment can include:  Medicines given through an IV tube.  Avoiding things that are known to cause your seizures. These can include medicines that you take for another condition.  Medicines to treat epilepsy.  Surgery to stop the seizures. This may be needed  if medicines do not help. Follow these instructions at home: Medicines  Take over-the-counter and prescription medicines only as told by your doctor.  Do not eat or drink anything that may keep your medicine from working, such as alcohol. Activity  Do not do any activities that would be dangerous if you had another seizure, like driving or swimming. Wait until your doctor says it is safe for you to do them.  If you live in the [place] ask your local DMV (department of motor vehicles) when you can drive.  Get plenty of rest. Teaching others Teach friends and family what to do when you have a seizure. They should:  Lay you on the ground.  Protect your head and body.  Loosen any tight clothing around your neck.  Turn you on your side.  Not hold you down.  Not put anything into your mouth.  Know whether or not you need emergency care.  Stay with you until you are better.  General instructions  Contact your doctor each time you have a seizure.  Avoid anything that gives you seizures.  Keep a seizure diary. Write down: ? What you think caused each seizure. ? What you remember about each seizure.  Keep all follow-up visits as told by your doctor. This is important. Contact a doctor if:  You have another seizure.  You have seizures more often.  There is any change in what happens during your seizures.  You keep having seizures with treatment.  You have symptoms of being sick or having an infection. Get help right away if:  You have a seizure that: ? Lasts longer than 5 minutes. ? Is different than seizures you had before. ? Makes it harder to breathe. ? Happens after you hurt your head.  You have any of these symptoms after a seizure: ? Not being able to speak. ? Not being able to use a part of your body. ? Confusion. ? A bad headache.  You have two or more seizures in a row.  You do not wake up right after a seizure.  You get hurt during a seizure.  These symptoms may be an emergency. Do not wait to see if the symptoms will go away. Get medical help right away. Call your local emergency services (911 in the [place] Do not drive yourself to the hospital. Summary  Seizures usually last from 30 seconds to 2 minutes. Usually, they are not harmful unless they last a long time.  Do not eat or drink anything that may keep your medicine from working, such as alcohol.  Teach friends and family what to do when you have a seizure.  Contact your doctor each time you have a seizure. This information is not intended to replace advice given to you by your health care provider. Make sure you discuss any questions you have with your health care provider. Document Released: 10/19/2007 Document Revised: 07/20/2018 Document Reviewed: 07/20/2018 Elsevier Patient Education  2020 [employer]

## 2019-01-10 LAB — COMPREHENSIVE METABOLIC PANEL
ALT: 19 IU/L (ref 0–32)
AST: 21 IU/L (ref 0–40)
Albumin/Globulin Ratio: 1.6 (ref 1.2–2.2)
Albumin: 4.1 g/dL (ref 3.8–4.8)
Alkaline Phosphatase: 91 IU/L (ref 39–117)
BUN/Creatinine Ratio: 18 (ref 9–23)
BUN: 13 mg/dL (ref 6–24)
Bilirubin Total: <0.2 mg/dL (ref 0.0–1.2)
CO2: 23 mmol/L (ref 20–29)
Calcium: 8.8 mg/dL (ref 8.7–10.2)
Chloride: 103 mmol/L (ref 96–106)
Creatinine, Ser: 0.74 mg/dL (ref 0.57–1.00)
GFR calc Af Amer: 111 mL/min/{1.73_m2} (ref >59)
GFR calc non Af Amer: 96 mL/min/{1.73_m2} (ref >59)
Globulin, Total: 2.6 g/dL (ref 1.5–4.5)
Glucose: 102 mg/dL — ABNORMAL HIGH (ref 65–99)
Potassium: 4.3 mmol/L (ref 3.5–5.2)
Sodium: 141 mmol/L (ref 134–144)
Total Protein: 6.7 g/dL (ref 6.0–8.5)

## 2019-01-10 LAB — CBC WITH DIFFERENTIAL/PLATELET
Basophils Absolute: 0.1 10*3/uL (ref 0.0–0.2)
Basos: 1 %
EOS (ABSOLUTE): 0.2 10*3/uL (ref 0.0–0.4)
Eos: 2 %
Hematocrit: 33.9 % — ABNORMAL LOW (ref 34.0–46.6)
Hemoglobin: 10.9 g/dL — ABNORMAL LOW (ref 11.1–15.9)
Immature Grans (Abs): 0 10*3/uL (ref 0.0–0.1)
Immature Granulocytes: 0 %
Lymphocytes Absolute: 1.4 10*3/uL (ref 0.7–3.1)
Lymphs: 21 %
MCH: 27.9 pg (ref 26.6–33.0)
MCHC: 32.2 g/dL (ref 31.5–35.7)
MCV: 87 fL (ref 79–97)
Monocytes Absolute: 0.7 10*3/uL (ref 0.1–0.9)
Monocytes: 11 %
Neutrophils Absolute: 4.2 10*3/uL (ref 1.4–7.0)
Neutrophils: 65 %
Platelets: 315 10*3/uL (ref 150–450)
RBC: 3.9 x10E6/uL (ref 3.77–5.28)
RDW: 13.6 % (ref 11.7–15.4)
WBC: 6.5 10*3/uL (ref 3.4–10.8)

## 2019-01-10 LAB — CARBAMAZEPINE LEVEL, TOTAL: Carbamazepine (Tegretol), S: 7.7 ug/mL (ref 4.0–12.0)

## 2019-01-14 ENCOUNTER — Ambulatory Visit (INDEPENDENT_AMBULATORY_CARE_PROVIDER_SITE_OTHER): Payer: 59 | Admitting: Family Medicine

## 2019-01-14 ENCOUNTER — Encounter: Payer: Self-pay | Admitting: Family Medicine

## 2019-01-14 ENCOUNTER — Telehealth: Payer: Self-pay | Admitting: *Deleted

## 2019-01-14 ENCOUNTER — Other Ambulatory Visit: Payer: Self-pay

## 2019-01-14 VITALS — BP 100/58 | HR 80 | Temp 98.9°F | Ht 63.5 in | Wt 216.0 lb

## 2019-01-14 DIAGNOSIS — Z0000 Encounter for general adult medical examination without abnormal findings: Secondary | ICD-10-CM

## 2019-01-14 DIAGNOSIS — Z13220 Encounter for screening for lipoid disorders: Secondary | ICD-10-CM | POA: Diagnosis not present

## 2019-01-14 DIAGNOSIS — Z23 Encounter for immunization: Secondary | ICD-10-CM | POA: Diagnosis not present

## 2019-01-14 DIAGNOSIS — Z124 Encounter for screening for malignant neoplasm of cervix: Secondary | ICD-10-CM

## 2019-01-14 NOTE — Patient Instructions (Addendum)
   If you have lab work done today you will be contacted with your lab results within the next 2 weeks.  If you have not heard from us then please contact us. The fastest way to get your results is to register for My Chart.   IF you received an x-ray today, you will receive an invoice from [facility]. Please contact [facility] at [phone] with questions or concerns regarding your invoice.   IF you received labwork today, you will receive an invoice from [facility]. Please contact [facility] at [phone] with questions or concerns regarding your invoice.   Our billing staff will not be able to assist you with questions regarding bills from these companies.  You will be contacted with the lab results as soon as they are available. The fastest way to get your results is to activate your My Chart account. Instructions are located on the last page of this paperwork. If you have not heard from us regarding the results in 2 weeks, please contact this office.     Preventive Care 40-64 Years Old, Female Preventive care refers to visits with your health care provider and lifestyle choices that can promote health and wellness. This includes:  A yearly physical exam. This may also be called an annual well check.  Regular dental visits and eye exams.  Immunizations.  Screening for certain conditions.  Healthy lifestyle choices, such as eating a healthy diet, getting regular exercise, not using drugs or products that contain nicotine and tobacco, and limiting alcohol use. What can I expect for my preventive care visit? Physical exam Your health care provider will check your:  Height and weight. This may be used to calculate body mass index (BMI), which tells if you are at a healthy weight.  Heart rate and blood pressure.  Skin for abnormal spots. Counseling Your health care provider may ask you questions about your:  Alcohol, tobacco, and drug use.  Emotional  well-being.  Home and relationship well-being.  Sexual activity.  Eating habits.  Work and work environment.  Method of birth control.  Menstrual cycle.  Pregnancy history. What immunizations do I need?  Influenza (flu) vaccine  This is recommended every year. Tetanus, diphtheria, and pertussis (Tdap) vaccine  You may need a Td booster every 10 years. Varicella (chickenpox) vaccine  You may need this if you have not been vaccinated. Zoster (shingles) vaccine  You may need this after age 60. Measles, mumps, and rubella (MMR) vaccine  You may need at least one dose of MMR if you were born in 1957 or later. You may also need a second dose. Pneumococcal conjugate (PCV13) vaccine  You may need this if you have certain conditions and were not previously vaccinated. Pneumococcal polysaccharide (PPSV23) vaccine  You may need one or two doses if you smoke cigarettes or if you have certain conditions. Meningococcal conjugate (MenACWY) vaccine  You may need this if you have certain conditions. Hepatitis A vaccine  You may need this if you have certain conditions or if you travel or work in places where you may be exposed to hepatitis A. Hepatitis B vaccine  You may need this if you have certain conditions or if you travel or work in places where you may be exposed to hepatitis B. Haemophilus influenzae type b (Hib) vaccine  You may need this if you have certain conditions. Human papillomavirus (HPV) vaccine  If recommended by your health care provider, you may need three doses over 6 months.   You may receive vaccines as individual doses or as more than one vaccine together in one shot (combination vaccines). Talk with your health care provider about the risks and benefits of combination vaccines. What tests do I need? Blood tests  Lipid and cholesterol levels. These may be checked every 5 years, or more frequently if you are over 50 years old.  Hepatitis C  test.  Hepatitis B test. Screening  Lung cancer screening. You may have this screening every year starting at age 55 if you have a 30-pack-year history of smoking and currently smoke or have quit within the past 15 years.  Colorectal cancer screening. All adults should have this screening starting at age 50 and continuing until age 75. Your health care provider may recommend screening at age 45 if you are at increased risk. You will have tests every 1-10 years, depending on your results and the type of screening test.  Diabetes screening. This is done by checking your blood sugar (glucose) after you have not eaten for a while (fasting). You may have this done every 1-3 years.  Mammogram. This may be done every 1-2 years. Talk with your health care provider about when you should start having regular mammograms. This may depend on whether you have a family history of breast cancer.  BRCA-related cancer screening. This may be done if you have a family history of breast, ovarian, tubal, or peritoneal cancers.  Pelvic exam and Pap test. This may be done every 3 years starting at age 21. Starting at age 30, this may be done every 5 years if you have a Pap test in combination with an HPV test. Other tests  Sexually transmitted disease (STD) testing.  Bone density scan. This is done to screen for osteoporosis. You may have this scan if you are at high risk for osteoporosis. Follow these instructions at home: Eating and drinking  Eat a diet that includes fresh fruits and vegetables, whole grains, lean protein, and low-fat dairy.  Take vitamin and mineral supplements as recommended by your health care provider.  Do not drink alcohol if: ? Your health care provider tells you not to drink. ? You are pregnant, may be pregnant, or are planning to become pregnant.  If you drink alcohol: ? Limit how much you have to 0-1 drink a day. ? Be aware of how much alcohol is in your drink. In the [place] one  drink equals one 12 oz bottle of beer (355 mL), one 5 oz glass of wine (148 mL), or one 1 oz glass of hard liquor (44 mL). Lifestyle  Take daily care of your teeth and gums.  Stay active. Exercise for at least 30 minutes on 5 or more days each week.  Do not use any products that contain nicotine or tobacco, such as cigarettes, e-cigarettes, and chewing tobacco. If you need help quitting, ask your health care provider.  If you are sexually active, practice safe sex. Use a condom or other form of birth control (contraception) in order to prevent pregnancy and STIs (sexually transmitted infections).  If told by your health care provider, take low-dose aspirin daily starting at age 50. What's next?  Visit your health care provider once a year for a well check visit.  Ask your health care provider how often you should have your eyes and teeth checked.  Stay up to date on all vaccines. This information is not intended to replace advice given to you by your health care provider. Make sure   you discuss any questions you have with your health care provider. Document Released: 05/29/2015 Document Revised: 01/11/2018 Document Reviewed: 01/11/2018 Elsevier Patient Education  2020 [employer]vier Inc.  

## 2019-01-14 NOTE — Telephone Encounter (Signed)
I called pt and relayed the results of her labs to her per AL/NP.  I let her know that her carbamazepine levels are normal. Glucose was just a touch elevated but may be normal if she had eaten prior to labs. Hemoglobin was just a touch low but not alarming. She had appt with her pcp this am.  Appreciated call.

## 2019-01-14 NOTE — Telephone Encounter (Signed)
-----   Message from Debbora Presto, NP sent at 01/10/2019  7:53 AM EDT ----- Please let her know that her carbamazepine levels are normal. Glucose was just a touch elevated but may be normal if she had eaten prior to labs. Hemoglobin was just a touch low but not alarming. She can follow up with PCP for repeat if indicated.
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
## 2019-01-15 LAB — LIPID PANEL
Chol/HDL Ratio: 3.5 ratio (ref 0.0–4.4)
Cholesterol, Total: 218 mg/dL — ABNORMAL HIGH (ref 100–199)
HDL: 63 mg/dL (ref >39)
LDL Chol Calc (NIH): 131 mg/dL — ABNORMAL HIGH (ref 0–99)
Triglycerides: 134 mg/dL (ref 0–149)
VLDL Cholesterol Cal: 24 mg/dL (ref 5–40)

## 2019-01-18 ENCOUNTER — Other Ambulatory Visit: Payer: Self-pay | Admitting: Neurology

## 2019-01-24 ENCOUNTER — Ambulatory Visit: Payer: 59 | Admitting: Adult Health

## 2019-02-11 ENCOUNTER — Telehealth: Payer: Self-pay

## 2019-02-11 NOTE — Telephone Encounter (Signed)
Pt called and left message on nurse line VM requesting a call back about her upcoming appt with Dr. Rosana Hoes on 10/2. Pt would like to know what "procedure 20" indicates on her appt note? She also would like to know if she is going to have a PAP as she does not do well with PAPs.

## 2019-02-13 NOTE — Telephone Encounter (Addendum)
I called Anna Pugh and informed her Dr.Davis would like her to keep the appointment and can just talk with her if needed for first visit and then schedule 2nd visit and can discuss getting medication. She voices understanding.  ,RN
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
## 2019-02-15 ENCOUNTER — Other Ambulatory Visit: Payer: Self-pay

## 2019-02-15 ENCOUNTER — Encounter: Payer: Self-pay | Admitting: Obstetrics and Gynecology

## 2019-02-15 ENCOUNTER — Ambulatory Visit (INDEPENDENT_AMBULATORY_CARE_PROVIDER_SITE_OTHER): Payer: Self-pay | Admitting: Obstetrics and Gynecology

## 2019-02-15 VITALS — BP 116/79 | HR 67 | Temp 98.5°F | Ht 62.0 in | Wt 219.6 lb

## 2019-02-15 DIAGNOSIS — Z1239 Encounter for other screening for malignant neoplasm of breast: Secondary | ICD-10-CM

## 2019-02-15 DIAGNOSIS — Z1151 Encounter for screening for human papillomavirus (HPV): Secondary | ICD-10-CM

## 2019-02-15 DIAGNOSIS — Z124 Encounter for screening for malignant neoplasm of cervix: Secondary | ICD-10-CM

## 2019-02-15 DIAGNOSIS — Z01419 Encounter for gynecological examination (general) (routine) without abnormal findings: Secondary | ICD-10-CM

## 2019-02-15 NOTE — Progress Notes (Signed)
GYNECOLOGY ANNUAL PREVENTATIVE CARE ENCOUNTER NOTE  Subjective:   Anna Pugh is a 48 y.o. G0 female here for a annual gynecologic exam. Current complaints: here for pap smear.  Denies abnormal vaginal bleeding, discharge, pelvic pain, other gynecologic concerns. She is not sexually active.   Has regular monthly periods, lasting 5-6 days. Not heavy, lighter than in the past. Used to have heavier periods. Has cramping that is improved with tylenol, has never been worse than that.    Gynecologic History Patient's last menstrual period was 01/09/2019 (approximate). Contraception: abstinence Last Pap: 10-15 years. Results were: normal Last mammogram: has never had  Obstetric History OB History  Gravida Para Term Preterm AB Living  0 0 0 0 0 0  SAB TAB Ectopic Multiple Live Births  0 0 0 0 0    Past Medical History:  Diagnosis Date  . GERD (gastroesophageal reflux disease)   . Localization-related (focal) (partial) epilepsy and epileptic syndromes with complex partial seizures, without mention of intractable epilepsy 01/04/2013  . Memory deficit   . Migraine without aura, without mention of intractable migraine without mention of status migrainosus 01/04/2013  . Obesity   . Seizures ([facility])     Past Surgical History:  Procedure Laterality Date  . TONSILLECTOMY      Current Outpatient Medications on File Prior to Visit  Medication Sig Dispense Refill  . carbamazepine ([name]) 200 MG 12 hr capsule Take 1 capsule (200mg ) in the am and 3 capsules (600mg ) in the evening 360 capsule 3  . carbamazepine (TEGRETOL-XR) 100 MG 12 hr tablet Take 2 tablets (200 mg total) by mouth every morning. 180 tablet 3  . cholecalciferol (VITAMIN D) 1000 units tablet Take 1,000 Units by mouth daily.    . Pediatric Multiple Vit-C-FA (PEDIATRIC MULTIVITAMIN) chewable tablet Chew 1 tablet by mouth daily.    . rizatriptan (MAXALT) 10 MG tablet Take 1 tablet (10 mg total) by mouth 3 (three) times daily  as needed for migraine. May repeat in 2 hours if needed 12 tablet 3   No current facility-administered medications on file prior to visit.     Allergies  Allergen Reactions  . Keppra [Levetiracetam]   . Lamictal [Lamotrigine]   . Topamax [Topiramate]   . Azithromycin Other (See Comments)    Disoriented and out of breath    Social History   Socioeconomic History  . Marital status: Single    Spouse name: Not on file  . Number of children: 0  . Years of education: Bachelor  . Highest education level: Not on file  Occupational History    Comment: Employed by  Social Needs  . Financial resource strain: Not on file  . Food insecurity    Worry: Not on file    Inability: Not on file  . Transportation needs    Medical: Not on file    Non-medical: Not on file  Tobacco Use  . Smoking status: Never Smoker  . Smokeless tobacco: Never Used  Substance and Sexual Activity  . Alcohol use: No  . Drug use: No  . Sexual activity: Not Currently  Lifestyle  . Physical activity    Days per week: Not on file    Minutes per session: Not on file  . Stress: Not on file  Relationships  . Social on phone: Not on file    Gets together: Not on file    Attends religious service: Not on file  Active member of club or organization: Not on file    Attends meetings of clubs or organizations: Not on file    Relationship status: Not on file  . Intimate partner violence    Fear of current or ex partner: Not on file    Emotionally abused: Not on file    Physically abused: Not on file    Forced sexual activity: Not on file  Other Topics Concern  . Not on file  Social History Narrative   Patient is single with no children.   Patient is left handed.   Patient has a Bachelor's degree.   Patient drinks 3 cups daily.    Family History  Problem Relation Age of Onset  . Migraines Father   . Myasthenia gravis Father   . Cancer Brother    Diet: "pretty good"  Exercise: doing "insanity" workout  The following portions of the patient's history were reviewed and updated as appropriate: allergies, current medications, past family history, past medical history, past social history, past surgical history and problem list.  Review of Systems Pertinent items are noted in HPI.   Objective:  BP 116/79   Pulse 67   Temp 98.5 F (36.9 C)   Ht 5\' 2"  (1.575 m)   Wt 219 lb 9.6 oz (99.6 kg)   LMP 01/09/2019 (Approximate)   BMI 40.17 kg/m  CONSTITUTIONAL: Well-developed, well-nourished female in no acute distress.  HENT:  Normocephalic, atraumatic, External right and left ear normal. Oropharynx is clear and moist EYES: Conjunctivae and EOM are normal. Pupils are equal, round, and reactive to light. No scleral icterus.  NECK: Normal range of motion, supple, no masses.  Normal thyroid.  SKIN: Skin is warm and dry. No rash noted. Not diaphoretic. No erythema. No pallor. NEUROLOGIC: Alert and oriented to person, place, and time. Normal reflexes, muscle tone coordination. No cranial nerve deficit noted. PSYCHIATRIC: Normal mood and affect. Normal behavior. Normal judgment and thought content. CARDIOVASCULAR: Normal heart rate noted, regular rhythm RESPIRATORY:  Effort and breath sounds normal, no problems with respiration noted. BREASTS: Symmetric in size. No masses, skin changes, nipple drainage, or lymphadenopathy. ABDOMEN: Soft, normal bowel sounds, no distention noted.  No tenderness, rebound or guarding.  PELVIC: Normal appearing external genitalia; normal appearing vaginal mucosa and cervix.  No abnormal discharge noted.  Pap smear obtained. Declined bimanual exam. MUSCULOSKELETAL: Normal range of motion. No tenderness.  No cyanosis, clubbing, or edema.  2+ distal pulses.  Exam done with chaperone present.  Assessment and Plan:   1. Well woman exam with routine gynecological exam Patient very anxious with exam, was able to tolerate pap - Cytology -  PAP( [facility]) - Reviewed menopause, symptoms - to call with any issues  2. Encounter for screening for malignant neoplasm of breast, unspecified screening modality - MM 3D SCREEN BREAST BILATERAL; Future   Will follow up results of pap smear and manage accordingly. Encouraged improvement in diet and exercise.  Mammogram ordered today Flu vaccine UTD   Routine preventative health maintenance measures emphasized. Please refer to After Visit Summary for other counseling recommendations.   Total face-to-face time with patient: 30 minutes. Over 50% of encounter was spent on counseling and coordination of care.   Feliz Beam, M.D. Attending Center for [employer] [facility])

## 2019-02-20 LAB — CYTOLOGY - PAP
Diagnosis: NEGATIVE
High risk HPV: NEGATIVE

## 2019-08-08 ENCOUNTER — Other Ambulatory Visit: Payer: Self-pay

## 2019-08-08 ENCOUNTER — Telehealth: Payer: Self-pay | Admitting: Family Medicine

## 2019-08-08 ENCOUNTER — Ambulatory Visit: Payer: 59 | Admitting: Family Medicine

## 2019-08-08 ENCOUNTER — Encounter: Payer: Self-pay | Admitting: Family Medicine

## 2019-08-08 VITALS — BP 122/76 | HR 78 | Temp 97.0°F | Ht 62.0 in | Wt 221.6 lb

## 2019-08-08 DIAGNOSIS — G43009 Migraine without aura, not intractable, without status migrainosus: Secondary | ICD-10-CM

## 2019-08-08 DIAGNOSIS — G40209 Localization-related (focal) (partial) symptomatic epilepsy and epileptic syndromes with complex partial seizures, not intractable, without status epilepticus: Secondary | ICD-10-CM | POA: Diagnosis not present

## 2019-08-08 MED ORDER — LACOSAMIDE 50 MG PO TABS: 50.0000 mg | ORAL_TABLET | Freq: Two times a day (BID) | ORAL | 5 refills | Status: DC
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
## 2019-08-08 NOTE — Progress Notes (Signed)
I have read the note, and I agree with the clinical assessment and plan.  Allesha Aronoff K Arien Morine   

## 2019-08-08 NOTE — Patient Instructions (Signed)
According to [place] law, you can not drive unless you are seizure / syncope free for at least 6 months and under physician's care.  Please maintain precautions. Do not participate in activities where a loss of awareness could harm you or someone else. No swimming alone, no tub bathing, no hot tubs, no driving, no operating motorized vehicles (cars, ATVs, motocycles, etc), lawnmowers, power tools or firearms. No standing at heights, such as rooftops, ladders or stairs. Avoid hot objects such as stoves, heaters, open fires. Wear a helmet when riding a bicycle, scooter, skateboard, etc. and avoid areas of traffic. Set your water heater to 120 degrees or less.   Continue Tegretol IR and XR as prescribed. We will add lacosamide 50mg  twice daily. Please take medication consistently and avoid missed doses. Stay well hydrated.   Follow up in 6 months    Lacosamide injection What is this medicine? LACOSAMIDE (la KOE sa mide) is used to control seizures caused by certain types of epilepsy. This medicine may be used for other purposes; ask your health care provider or pharmacist if you have questions. COMMON BRAND NAME(S): Vimpat What should I tell my health care provider before I take this medicine? They need to know if you have any of these conditions:  drug abuse or addiction  heart disease  kidney disease  liver disease  suicidal thoughts, plans, or attempt; a previous suicide attempt by you or a family member  an unusual or allergic reaction to lacosamide, other medicines, foods, dyes, or preservatives  pregnant or trying to get pregnant  breast-feeding How should I use this medicine? This medicine is for injection into a vein. It is given by a health care professional in a hospital or clinic setting. A special MedGuide will be given to you before each treatment. Be sure to read this information carefully each time. Talk to your pediatrician regarding the use of this medicine in  children. Overdosage: If you think you have taken too much of this medicine contact a poison control center or emergency room at once. NOTE: This medicine is only for you. Do not share this medicine with others. What if I miss a dose? This does not apply. This medicine is given by a health care professional in a hospital or clinic setting. What may interact with this medicine? This medicine may interact with the following medications:  atazanavir  beta-blockers like metoprolol and propranolol  calcium channel blockers like diltiazem and verapamil  certain medicines for irregular heart beat like amiodarone, bepridil, dofetilide, encainide, flecainide, propafenone, quinidine  certain medicines for seizures like carbamazepine, phenytoin  digoxin  dronedarone  lopinavir/ritonavir This list may not describe all possible interactions. Give your health care provider a list of all the medicines, herbs, non-prescription drugs, or dietary supplements you use. Also tell them if you smoke, drink alcohol, or use illegal drugs. Some items may interact with your medicine. What should I watch for while using this medicine? Your condition will be monitored carefully while you are receiving this medicine. This medicine may cause serious skin reactions. They can happen weeks to months after starting the medicine. Contact your health care provider right away if you notice fevers or flu-like symptoms with a rash. The rash may be red or purple and then turn into blisters or peeling of the skin. Or, you might notice a red rash with swelling of the face, lips or lymph nodes in your neck or under your arms. Wear a medical ID bracelet or  chain, and carry a card that describes your disease and details of your medicine and dosage times. You may get drowsy or dizzy. Do not drive, use machinery, or do anything that needs mental alertness until you know how this medicine affects you. Do not stand or sit up quickly,  especially if you are an older patient. This reduces the risk of dizzy or fainting spells. Alcohol may interfere with the effect of this medicine. Avoid alcoholic drinks. The use of this medicine may increase the chance of suicidal thoughts or actions. Pay special attention to how you are responding while on this medicine. Any worsening of mood, or thoughts of suicide or dying should be reported to your health care provider right away. What side effects may I notice from receiving this medicine? Side effects that you should report to your doctor or health care professional as soon as possible:  allergic reactions like skin rash, itching or hives, swelling of the face, lips, or tongue  rash, fever, and swollen lymph nodes  signs and symptoms of a dangerous change in heartbeat or heart rhythm like chest pain; dizziness; fast, irregular heartbeat; palpitations; feeling faint or lightheaded; falls; breathing problems  signs and symptoms of liver injury like dark yellow or brown urine; general ill feeling or flu-like symptoms; light-colored stools; loss of appetite; nausea; right upper belly pain; unusually weak or tired; yellowing of the eyes or skin  suicidal thoughts, mood changes Side effects that usually do not require medical attention (report to your doctor or health care professional if they continue or are bothersome):  blurred vision  dizziness  drowsiness  headache  nausea  pain, redness, or irritation at site where injected This list may not describe all possible side effects. Call your doctor for medical advice about side effects. You may report side effects to FDA at 1-800-FDA-1088. Where should I keep my medicine? This does not apply. You will not be given this medicine to store at home. NOTE: This sheet is a summary. It may not cover all possible information. If you have questions about this medicine, talk to your doctor, pharmacist, or health care provider.  2020  Elsevier/Gold Standard (2018-08-03 14:53:24)   Seizure, Adult A seizure is a sudden burst of abnormal electrical activity in the brain. Seizures usually last from 30 seconds to 2 minutes. They can cause many different symptoms. Usually, seizures are not harmful unless they last a long time. What are the causes? Common causes of this condition include:  Fever or infection.  Conditions that affect the brain, such as: ? A brain abnormality that you were born with. ? A brain or head injury. ? Bleeding in the brain. ? A tumor. ? Stroke. ? Brain disorders such as autism or cerebral palsy.  Low blood sugar.  Conditions that are passed from parent to child (are inherited).  Problems with substances, such as: ? Having a reaction to a drug or a medicine. ? Suddenly stopping the use of a substance (withdrawal). In some cases, the cause may not be known. A person who has repeated seizures over time without a clear cause has a condition called epilepsy. What increases the risk? You are more likely to get this condition if you have:  A family history of epilepsy.  Had a seizure in the past.  A brain disorder.  A history of head injury, lack of oxygen at birth, or strokes. What are the signs or symptoms? There are many types of seizures. The symptoms vary depending  on the type of seizure you have. Examples of symptoms during a seizure include:  Shaking (convulsions).  Stiffness in the body.  Passing out (losing consciousness).  Head nodding.  Staring.  Not responding to sound or touch.  Loss of bladder control and bowel control. Some people have symptoms right before and right after a seizure happens. Symptoms before a seizure may include:  Fear.  Worry (anxiety).  Feeling like you may vomit (nauseous).  Feeling like the room is spinning (vertigo).  Feeling like you saw or heard something before (dj vu).  Odd tastes or smells.  Changes in how you see. You may  see flashing lights or spots. Symptoms after a seizure happens can include:  Confusion.  Sleepiness.  Headache.  Weakness on one side of the body. How is this treated? Most seizures will stop on their own in under 5 minutes. In these cases, no treatment is needed. Seizures that last longer than 5 minutes will usually need treatment. Treatment can include:  Medicines given through an IV tube.  Avoiding things that are known to cause your seizures. These can include medicines that you take for another condition.  Medicines to treat epilepsy.  Surgery to stop the seizures. This may be needed if medicines do not help. Follow these instructions at home: Medicines  Take over-the-counter and prescription medicines only as told by your doctor.  Do not eat or drink anything that may keep your medicine from working, such as alcohol. Activity  Do not do any activities that would be dangerous if you had another seizure, like driving or swimming. Wait until your doctor says it is safe for you to do them.  If you live in the [place] ask your local DMV (department of motor vehicles) when you can drive.  Get plenty of rest. Teaching others Teach friends and family what to do when you have a seizure. They should:  Lay you on the ground.  Protect your head and body.  Loosen any tight clothing around your neck.  Turn you on your side.  Not hold you down.  Not put anything into your mouth.  Know whether or not you need emergency care.  Stay with you until you are better.  General instructions  Contact your doctor each time you have a seizure.  Avoid anything that gives you seizures.  Keep a seizure diary. Write down: ? What you think caused each seizure. ? What you remember about each seizure.  Keep all follow-up visits as told by your doctor. This is important. Contact a doctor if:  You have another seizure.  You have seizures more often.  There is any change in what  happens during your seizures.  You keep having seizures with treatment.  You have symptoms of being sick or having an infection. Get help right away if:  You have a seizure that: ? Lasts longer than 5 minutes. ? Is different than seizures you had before. ? Makes it harder to breathe. ? Happens after you hurt your head.  You have any of these symptoms after a seizure: ? Not being able to speak. ? Not being able to use a part of your body. ? Confusion. ? A bad headache.  You have two or more seizures in a row.  You do not wake up right after a seizure.  You get hurt during a seizure. These symptoms may be an emergency. Do not wait to see if the symptoms will go away. Get medical help right  away. Call your local emergency services (911 in the [place] Do not drive yourself to the hospital. Summary  Seizures usually last from 30 seconds to 2 minutes. Usually, they are not harmful unless they last a long time.  Do not eat or drink anything that may keep your medicine from working, such as alcohol.  Teach friends and family what to do when you have a seizure.  Contact your doctor each time you have a seizure. This information is not intended to replace advice given to you by your health care provider. Make sure you discuss any questions you have with your health care provider. Document Revised: 07/20/2018 Document Reviewed: 07/20/2018 Elsevier Patient Education  2020 [employer].
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
## 2019-08-09 LAB — CBC WITH DIFFERENTIAL/PLATELET
Basophils Absolute: 0.1 10*3/uL (ref 0.0–0.2)
Basos: 1 %
EOS (ABSOLUTE): 0.1 10*3/uL (ref 0.0–0.4)
Eos: 1 %
Hematocrit: 38.3 % (ref 34.0–46.6)
Hemoglobin: 12.7 g/dL (ref 11.1–15.9)
Immature Grans (Abs): 0 10*3/uL (ref 0.0–0.1)
Immature Granulocytes: 0 %
Lymphocytes Absolute: 1.6 10*3/uL (ref 0.7–3.1)
Lymphs: 25 %
MCH: 28.3 pg (ref 26.6–33.0)
MCHC: 33.2 g/dL (ref 31.5–35.7)
MCV: 85 fL (ref 79–97)
Monocytes Absolute: 0.7 10*3/uL (ref 0.1–0.9)
Monocytes: 11 %
Neutrophils Absolute: 4 10*3/uL (ref 1.4–7.0)
Neutrophils: 62 %
Platelets: 358 10*3/uL (ref 150–450)
RBC: 4.49 x10E6/uL (ref 3.77–5.28)
RDW: 13.9 % (ref 11.7–15.4)
WBC: 6.5 10*3/uL (ref 3.4–10.8)

## 2019-08-09 LAB — COMPREHENSIVE METABOLIC PANEL
ALT: 20 IU/L (ref 0–32)
AST: 24 IU/L (ref 0–40)
Albumin/Globulin Ratio: 1.4 (ref 1.2–2.2)
Albumin: 4.2 g/dL (ref 3.8–4.8)
Alkaline Phosphatase: 115 IU/L (ref 39–117)
BUN/Creatinine Ratio: 14 (ref 9–23)
BUN: 11 mg/dL (ref 6–24)
Bilirubin Total: <0.2 mg/dL (ref 0.0–1.2)
CO2: 21 mmol/L (ref 20–29)
Calcium: 9.2 mg/dL (ref 8.7–10.2)
Chloride: 104 mmol/L (ref 96–106)
Creatinine, Ser: 0.76 mg/dL (ref 0.57–1.00)
GFR calc Af Amer: 107 mL/min/{1.73_m2} (ref >59)
GFR calc non Af Amer: 92 mL/min/{1.73_m2} (ref >59)
Globulin, Total: 3.1 g/dL (ref 1.5–4.5)
Glucose: 102 mg/dL — ABNORMAL HIGH (ref 65–99)
Potassium: 3.9 mmol/L (ref 3.5–5.2)
Sodium: 143 mmol/L (ref 134–144)
Total Protein: 7.3 g/dL (ref 6.0–8.5)

## 2019-08-09 LAB — CARBAMAZEPINE LEVEL, TOTAL: Carbamazepine (Tegretol), S: 9.4 ug/mL (ref 4.0–12.0)

## 2019-08-15 ENCOUNTER — Telehealth: Payer: Self-pay | Admitting: Neurology

## 2019-08-15 NOTE — Telephone Encounter (Signed)
-----   Message from Judi Cong, RN sent at 08/15/2019  4:41 PM EDT -----  ----- Message ----- From: Shawnie Dapper, NP Sent: 08/14/2019   4:48 PM EDT To: Guy Begin, RN  Labs look ok!

## 2019-08-15 NOTE — Telephone Encounter (Signed)
Called the patient and advised the labs looked good and that [facility] didn't see anything concerning.

## 2019-08-20 ENCOUNTER — Ambulatory Visit: Payer: 59 | Admitting: Family Medicine

## 2019-08-23 ENCOUNTER — Telehealth: Payer: Self-pay | Admitting: Family Medicine
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
## 2019-08-26 NOTE — Telephone Encounter (Signed)
I called pt and relayed that hard to assess what happened after resolution.  Relayed that is happened again to seek emergent evaluation.  She verbalized understanding. She verbalized understanding.

## 2019-08-26 NOTE — Telephone Encounter (Signed)
I called pt.  She states that she had bad migraine day before,  Next day felt good to [facility] to [facility] , on the way there was noted to feel being lead to right side, tired, numbness legs, confused feeling, whne got home slept, had slight headache.  Feela back to basleing now.  Taking vimpat 50mg  po bid. Reccs?

## 2019-08-26 NOTE — Telephone Encounter (Signed)
It is very difficult to assess these symptoms after resolution. I would recommend emergent evaluation if symptoms return. I am reassured she is feeling back to baseline.

## 2019-08-27 ENCOUNTER — Telehealth: Payer: Self-pay | Admitting: Family Medicine

## 2019-08-27 MED ORDER — RIZATRIPTAN BENZOATE 10 MG PO TABS: 10.0000 mg | ORAL_TABLET | Freq: Three times a day (TID) | ORAL | 3 refills | Status: DC | PRN

## 2019-08-27 NOTE — Telephone Encounter (Signed)
Pt called stating that she has not had bad migraines in a while but lately she has had a few and is wanting to know if rizatriptan (MAXALT) 10 MG tablet or something else can be called in for her so she has it when she get's a migraine.

## 2019-08-28 ENCOUNTER — Encounter: Payer: Self-pay | Admitting: *Deleted

## 2019-09-13 ENCOUNTER — Telehealth: Payer: Self-pay | Admitting: Family Medicine

## 2019-09-13 DIAGNOSIS — G40209 Localization-related (focal) (partial) symptomatic epilepsy and epileptic syndromes with complex partial seizures, not intractable, without status epilepticus: Secondary | ICD-10-CM

## 2019-09-13 NOTE — Telephone Encounter (Signed)
Pt states due to being unable to do all assignments on her job this has caused her to cut hours at work and will soon no longer be insured.  Pt is asking if she provides proof(check stubs) would she be elgible for pt assistance.  Pt would like it on all her medications, if that is not possible pt would like to try for her lacosamide (VIMPAT) 50 MG TABS tablet please call

## 2019-09-19 NOTE — Telephone Encounter (Signed)
Pt called wanting to know the update on the pt assistance program. Please advise.

## 2019-09-23 NOTE — Telephone Encounter (Signed)
Sandy I have tried to Reach patient about her PAP for Vimpat . I will be out of the office until 10/01/2018 . IF patient come's she will need what's  listed below.  1. She will need to sign all high lighted area's  2. Bring Proof of Income .  Brin need's to sign her part and a hard copy RX is needed. Form will need to be faxed.

## 2019-09-26 NOTE — Telephone Encounter (Signed)
PAP application and information VIMPAT faxed to [employer] [phone] with fax confirmation received.  P/W given to [employer].

## 2019-10-01 NOTE — Telephone Encounter (Signed)
I talked to patient to she is aware her patient assistance is pending.

## 2019-10-01 NOTE — Telephone Encounter (Signed)
Tiffany with [employer] called to  Request the pts vimpat prescription be faxed over to them provided fax and phone#   fax# [phone]  Phone# [phone]

## 2019-10-02 MED ORDER — LACOSAMIDE 50 MG PO TABS: 50.0000 mg | ORAL_TABLET | Freq: Two times a day (BID) | ORAL | 11 refills | Status: DC

## 2019-10-02 NOTE — Telephone Encounter (Signed)
Sandy , I need a Hard copy RX please . Thanks Annabelle Harman.

## 2019-10-02 NOTE — Telephone Encounter (Signed)
Prescription given to [facility] for fax to [employer] PAP.

## 2019-10-02 NOTE — Addendum Note (Signed)
Addended by: Hermenia Fiscal S on: 10/02/2019 10:40 AM   Modules accepted: Orders

## 2019-10-02 NOTE — Telephone Encounter (Signed)
Noted RX has been seen to [employer]

## 2019-10-04 NOTE — Telephone Encounter (Signed)
Vimpat PAP program calling stating VIMPAT can only been from MD. Dr. Anne Hahn I need a hard copy RX for Vimpat 50 mg . Hard copy RX . Thanks Annabelle Harman

## 2019-10-07 ENCOUNTER — Telehealth: Payer: Self-pay

## 2019-10-07 MED ORDER — LACOSAMIDE 50 MG PO TABS: 50.0000 mg | ORAL_TABLET | Freq: Two times a day (BID) | ORAL | 1 refills | Status: DC

## 2019-10-07 NOTE — Telephone Encounter (Signed)
Prescription was written

## 2019-10-07 NOTE — Telephone Encounter (Signed)
Lacosamide(vimpat) fax to [facility] at [phone] twice and confirmed x3 .

## 2019-10-07 NOTE — Addendum Note (Signed)
Addended by: York Spaniel on: 10/07/2019 07:02 AM   Modules accepted: Orders

## 2019-10-08 NOTE — Telephone Encounter (Signed)
[email or web address]@ [employer] Patient Assistant Program has called for [employer].  Tiffany stated a prescription signed by Dr is needed for the Vimpat.  Tiffany said this prescription can be faxed to [phone] Elmarie Shiley can be reached at [phone] option 2

## 2019-10-08 NOTE — Telephone Encounter (Signed)
New RX sent to [facility] fax [phone]

## 2019-10-08 NOTE — Telephone Encounter (Signed)
RX for vimpat signed by Dr. Anne Hahn given to [place].

## 2019-11-03 ENCOUNTER — Other Ambulatory Visit: Payer: Self-pay

## 2019-11-03 ENCOUNTER — Encounter (HOSPITAL_COMMUNITY): Payer: Self-pay | Admitting: Emergency Medicine

## 2019-11-03 ENCOUNTER — Emergency Department (HOSPITAL_COMMUNITY)
Admission: EM | Admit: 2019-11-03 | Discharge: 2019-11-03 | Disposition: A | Discharge status: Home / Self Care | Payer: 59 | Attending: Emergency Medicine | Admitting: Emergency Medicine

## 2019-11-03 DIAGNOSIS — Z888 Allergy status to other drugs, medicaments and biological substances status: Secondary | ICD-10-CM | POA: Insufficient documentation

## 2019-11-03 DIAGNOSIS — Z880 Allergy status to penicillin: Secondary | ICD-10-CM | POA: Diagnosis not present

## 2019-11-03 DIAGNOSIS — R42 Dizziness and giddiness: Secondary | ICD-10-CM | POA: Diagnosis not present

## 2019-11-03 DIAGNOSIS — R202 Paresthesia of skin: Secondary | ICD-10-CM | POA: Diagnosis not present

## 2019-11-03 DIAGNOSIS — Z7952 Long term (current) use of systemic steroids: Secondary | ICD-10-CM | POA: Diagnosis not present

## 2019-11-03 DIAGNOSIS — R55 Syncope and collapse: Secondary | ICD-10-CM | POA: Diagnosis not present

## 2019-11-03 DIAGNOSIS — R519 Headache, unspecified: Secondary | ICD-10-CM | POA: Diagnosis present

## 2019-11-03 DIAGNOSIS — Z79899 Other long term (current) drug therapy: Secondary | ICD-10-CM | POA: Insufficient documentation

## 2019-11-03 LAB — BASIC METABOLIC PANEL
Anion gap: 7 (ref 5–15)
BUN: 17 mg/dL (ref 6–20)
CO2: 27 mmol/L (ref 22–32)
Calcium: 8.8 mg/dL — ABNORMAL LOW (ref 8.9–10.3)
Chloride: 107 mmol/L (ref 98–111)
Creatinine, Ser: 0.66 mg/dL (ref 0.44–1.00)
GFR calc Af Amer: >60 mL/min (ref >60)
GFR calc non Af Amer: >60 mL/min (ref >60)
Glucose, Bld: 105 mg/dL — ABNORMAL HIGH (ref 70–99)
Potassium: 4.1 mmol/L (ref 3.5–5.1)
Sodium: 141 mmol/L (ref 135–145)

## 2019-11-03 LAB — I-STAT BETA HCG BLOOD, ED (MC, WL, AP ONLY): I-stat hCG, quantitative: <5 m[IU]/mL (ref <5)

## 2019-11-03 LAB — CBC
HCT: 41 % (ref 36.0–46.0)
Hemoglobin: 13.3 g/dL (ref 12.0–15.0)
MCH: 29 pg (ref 26.0–34.0)
MCHC: 32.4 g/dL (ref 30.0–36.0)
MCV: 89.3 fL (ref 80.0–100.0)
Platelets: 323 10*3/uL (ref 150–400)
RBC: 4.59 MIL/uL (ref 3.87–5.11)
RDW: 13.8 % (ref 11.5–15.5)
WBC: 9.1 10*3/uL (ref 4.0–10.5)
nRBC: 0 % (ref 0.0–0.2)

## 2019-11-03 LAB — CBG MONITORING, ED: Glucose-Capillary: 99 mg/dL (ref 70–99)

## 2019-11-03 MED ORDER — SODIUM CHLORIDE 0.9 % IV BOLUS
500.0000 mL | Freq: Once | INTRAVENOUS | Status: AC
  Administered 2019-11-03: 500 mL via INTRAVENOUS

## 2019-11-03 MED ORDER — METOCLOPRAMIDE HCL 5 MG/ML IJ SOLN
10.0000 mg | Freq: Once | INTRAMUSCULAR | Status: AC
  Administered 2019-11-03: 10 mg via INTRAVENOUS
  Filled 2019-11-03: qty 2

## 2019-11-03 NOTE — ED Triage Notes (Addendum)
Patient reports headache today, states took prescribed migraine medicine without relief. Reports near syncopal episode today with period of confusion and weakness. Hx seizures. Reports taking medications as prescribed. Denies LOC. Denies chest pain and SOB.

## 2019-11-03 NOTE — ED Provider Notes (Signed)
[facility]-EMERGENCY DEPT Provider Note   CSN: [id number] Arrival date & time: 11/03/19  1511     History Chief Complaint  Patient presents with  . Headache  . Near Syncope    Anna Pugh is a 49 y.o. female.  The history is provided by the patient and medical records. No language interpreter was used.  Headache Associated symptoms: near-syncope   Near Syncope Associated symptoms include headaches.   Anna Pugh is a 49 y.o. female who presents to the Emergency Department complaining of headache and lightheadedness. She presents the emergency department complaining of posterior headache that started around 1245 today. Headache is described as dull in nature and now resolved. She has persistent lightheadedness associated with this. She feels numbness to both arms and legs. She feels generally unwell. She has been feeling unwell for the last few days in the evenings. She has been feeling better in the mornings when she wakes up. She took a Maxalt today, which resolved the headache. She does have a history of epilepsy and did have a seizure in March of this year. She is compliant with all her medications. She denies any fevers, chest pain, shortness of breath, vomiting, abdominal pain, dysuria, leg swelling. She does not take any hormones. No known sick contacts. She has been vaccinated for COVID-19.    Past Medical History:  Diagnosis Date  . GERD (gastroesophageal reflux disease)   . Localization-related (focal) (partial) epilepsy and epileptic syndromes with complex partial seizures, without mention of intractable epilepsy 01/04/2013  . Memory deficit   . Migraine without aura, without mention of intractable migraine without mention of status migrainosus 01/04/2013  . Obesity   . Seizures [facility])     Patient Active Problem List   Diagnosis Date Noted  . Fatigue 10/09/2018  . Advice given about COVID-19 virus by telephone 10/09/2018  . Partial epilepsy with  impairment of consciousness (HCC) 01/04/2013  . Migraine without aura 01/04/2013    Past Surgical History:  Procedure Laterality Date  . TONSILLECTOMY       OB History    Gravida  0   Para  0   Term  0   Preterm  0   AB  0   Living  0     SAB  0   TAB  0   Ectopic  0   Multiple  0   Live Births  0           Family History  Problem Relation Age of Onset  . Migraines Father   . Myasthenia gravis Father   . Cancer Brother     Social History   Tobacco Use  . Smoking status: Never Smoker  . Smokeless tobacco: Never Used  Substance Use Topics  . Alcohol use: No  . Drug use: No    Home Medications Prior to Admission medications   Medication Sig Start Date End Date Taking? Authorizing Provider  carbamazepine ([name]) 200 MG 12 hr capsule Take 1 capsule (200mg ) in the am and 3 capsules (600mg ) in the evening 01/09/19  Yes Lomax, Aayat, NP  carbamazepine (TEGRETOL-XR) 100 MG 12 hr tablet Take 2 tablets (200 mg total) by mouth every morning. 01/09/19  Yes Lomax, Tiajah, NP  cetirizine (ZYRTEC) 10 MG tablet Take 10 mg by mouth daily.   Yes [provider]  cholecalciferol (VITAMIN D) 1000 units tablet Take 1,000 Units by mouth daily.   Yes [provider]  lacosamide (VIMPAT) 50 MG TABS tablet  Take 1 tablet (50 mg total) by mouth 2 (two) times daily. 10/07/19  Yes Kathrynn Ducking, MD  Pediatric Multiple Vit-C-FA (PEDIATRIC MULTIVITAMIN) chewable tablet Chew 1 tablet by mouth daily.   Yes [provider]  rizatriptan (MAXALT) 10 MG tablet Take 1 tablet (10 mg total) by mouth 3 (three) times daily as needed for migraine. May repeat in 2 hours if needed 08/27/19  Yes Lomax, Taquana, NP    Allergies    Keppra [levetiracetam], Lamotrigine, Topamax [topiramate], and Azithromycin  Review of Systems   Review of Systems  Cardiovascular: Positive for near-syncope.  Neurological: Positive for headaches.  All other systems reviewed and are  negative.   Physical Exam Updated Vital Signs BP 134/87   Pulse 89   Temp 98.6 F (37 C)   Resp 19   LMP 10/27/2019   SpO2 98%   Physical Exam Vitals and nursing note reviewed.  Constitutional:      Appearance: She is well-developed.  HENT:     Head: Normocephalic and atraumatic.     Mouth/Throat:     Mouth: Mucous membranes are moist.  Eyes:     Extraocular Movements: Extraocular movements intact.     Pupils: Pupils are equal, round, and reactive to light.  Cardiovascular:     Rate and Rhythm: Normal rate and regular rhythm.     Heart sounds: No murmur heard.   Pulmonary:     Effort: Pulmonary effort is normal. No respiratory distress.     Breath sounds: Normal breath sounds.  Abdominal:     Palpations: Abdomen is soft.     Tenderness: There is no abdominal tenderness. There is no guarding or rebound.  Musculoskeletal:        General: No tenderness.     Cervical back: Neck supple.  Skin:    General: Skin is warm and dry.  Neurological:     Mental Status: She is alert and oriented to person, place, and time.     Comments: No asymmetry of facial movements. Visual fields are grossly intact. Five out of five strength in all four extremities with sensation light touch intact in all four extremities  Psychiatric:        Behavior: Behavior normal.     ED Results / Procedures / Treatments   Labs (all labs ordered are listed, but only abnormal results are displayed) Labs Reviewed  BASIC METABOLIC PANEL - Abnormal; Notable for the following components:      Result Value   Glucose, Bld 105 (*)    Calcium 8.8 (*)    All other components within normal limits  CBC  URINALYSIS, ROUTINE W REFLEX MICROSCOPIC  CBG MONITORING, ED  I-STAT BETA HCG BLOOD, ED ([facility], [facility], [facility] ONLY)    EKG EKG Interpretation  Date/Time:  Sunday November 03 2019 18:51:53 EDT Ventricular Rate:  60 PR Interval:    QRS Duration: 120 QT Interval:  438 QTC Calculation: 438 R Axis:   95 Text  Interpretation: Sinus rhythm Nonspecific intraventricular conduction delay Confirmed by Quintella Reichert [phone]) on 11/03/2019 6:57:57 PM   Radiology No results found.  Procedures Procedures (including critical care time)  Medications Ordered in ED Medications  metoCLOPramide (REGLAN) injection 10 mg (10 mg Intravenous Given 11/03/19 1837)  sodium chloride 0.9 % bolus 500 mL (500 mLs Intravenous New Bag/Given 11/03/19 1837)    ED Course  I have reviewed the triage vital signs and the nursing notes.  Pertinent labs & imaging results that were available during  my care of the patient were reviewed by me and considered in my medical decision making (see chart for details).    MDM Rules/Calculators/A&P                         Patient with history of epilepsy here for evaluation of headache, lightheadedness. She is non-toxic appearing on evaluation with no focal neurologic deficits. Presentation is not consistent with subarachnoid hemorrhage, CVA, meningitis. Following treatment with medications in the department she is feeling improved. She is able to stand without recurrent lightheadedness. Discussed with patient concern for migraine headache causing her symptoms. Discussed neurology follow-up.  Final Clinical Impression(s) / ED Diagnoses Final diagnoses:  Bad headache  Near syncope    Rx / DC Orders ED Discharge Orders    None       Tilden Fossa, MD 11/03/19 2014

## 2019-11-12 ENCOUNTER — Telehealth: Payer: Self-pay

## 2019-11-12 NOTE — Telephone Encounter (Signed)
I called pt and she is having sx that are sporadic,  (some lightheadness, dizziness, numbness in legs).  Can last 20 min.  She thought maybe vimpat.  She has been on vimpat since 08-08-19. 50mg  po bid.  I did not think related to this since she has been tolerating. Was seen in ED 11-03-19 for migraines.  She does that the sx presenting (but ususally with headache and happens acutely).  She takes rizatriptan.  She was not sure and wanted our input.  Please advise.

## 2019-11-12 NOTE — Telephone Encounter (Signed)
Voicemail message, 10:06am:  Patient says that she believes she may be having side effects from the Vimpat. She has been experiencing lightheadedness, nausea, and numbness for about a week or so. The symptoms come and go. She will be available to speak after 4pm.

## 2019-11-13 NOTE — Telephone Encounter (Signed)
I called pt and LMVM for her re: both responses from AL/NP.  She can monitor and she how she does or if she wants to change to another AED.  She has had no seizures.  Sx presenting could be  Migraine, but hard to say.  Will wait to here from her on what she would want to do.

## 2019-11-13 NOTE — Telephone Encounter (Signed)
I would think more likely related to migraine as she has tolerated it thus far, however, these are potential side effects. It will be impossible to know at this time. Symptoms could also be related to allergies, hydration status, sleep, stress, etc. It will be something that we should discuss if she wishes to switch AED.

## 2019-11-13 NOTE — Telephone Encounter (Signed)
Dizziness and headaches are common side effects with Vimpat. Numbness of legs is not as common but possible I suppose. Has she had any more seizures? We can stop Vimpat and add anther seizure agent. Unfortunately, they all can have similar side effects. She is allergic to Keppra, Lamictal and Topamax. We could try Depakote if she hasn't already?

## 2019-11-15 ENCOUNTER — Telehealth: Payer: Self-pay | Admitting: Neurology

## 2019-11-15 NOTE — Telephone Encounter (Signed)
I returned patient's call from answering service regarding questions about her medication.  I left message on her answering service to call me back if needed

## 2019-11-20 ENCOUNTER — Telehealth: Payer: Self-pay | Admitting: *Deleted

## 2019-11-20 NOTE — Telephone Encounter (Signed)
I have read her letter. It sounds like she is worried about having to vacate her apartment due to upcoming renovations which will be an inconvenience as she is not currently driving while being evaluated for seizures. I have a few questions. It sounds like they have not contacted her yet to ask her to vacate. Also, I am uncertain what renovations will be done. Will these renovations put her at any safety risks if she stays? How long will she need to vacate? When will this take place? I would have her talk to her apartment complex to learn more about their plans. It would definitely be more convenient for her [place] to [place] bus line [place] but we will have to consider all of the above mentioned details before we can provide input.

## 2019-11-20 NOTE — Telephone Encounter (Signed)
Received letter from pt.  Asking for letter stating diagnosis and importance of staying centrally located due to her condition.

## 2019-11-20 NOTE — Telephone Encounter (Signed)
I called pt.  She has not received letter.  She did go speak to her complex, and relayed that she did want to stay and be moved to another 2 bedroom complex.  (she did not mention her medical condition at the time).  She was anticipating the issue. I relayed that she will need to let us know if she receives letter and is so will be addressed again.  They are renovating the whole apartment.

## 2020-01-01 ENCOUNTER — Other Ambulatory Visit: Payer: Self-pay | Admitting: *Deleted

## 2020-01-01 MED ORDER — CARBAMAZEPINE ER 200 MG PO CP12: ORAL_CAPSULE | ORAL | 3 refills | Status: DC

## 2020-01-01 MED ORDER — CARBAMAZEPINE ER 100 MG PO TB12: 200.0000 mg | ORAL_TABLET | ORAL | 3 refills | Status: DC

## 2020-01-04 ENCOUNTER — Encounter (HOSPITAL_COMMUNITY): Payer: Self-pay

## 2020-01-04 ENCOUNTER — Ambulatory Visit (HOSPITAL_COMMUNITY)
Admission: EM | Admit: 2020-01-04 | Discharge: 2020-01-04 | Disposition: A | Discharge status: Home / Self Care | Payer: 59 | Attending: Emergency Medicine | Admitting: Emergency Medicine

## 2020-01-04 ENCOUNTER — Other Ambulatory Visit: Payer: Self-pay

## 2020-01-04 DIAGNOSIS — M25562 Pain in left knee: Secondary | ICD-10-CM

## 2020-01-04 DIAGNOSIS — S93601D Unspecified sprain of right foot, subsequent encounter: Secondary | ICD-10-CM

## 2020-01-04 NOTE — Discharge Instructions (Signed)
Activity as tolerated.  Ice application to foot as well as to knee, especially after activity.  Naproxen twice a day, take with food.  See knee exercises provided. Perform as able.  Knee sleeve as needed for comfort, primarily with activity.  Follow up with sports medicine or orthopedics for further evaluation if symptoms persist or continue to worsen.

## 2020-01-04 NOTE — ED Triage Notes (Signed)
Pt present right/ left foot and knee pain. Pt recently fall two weeks ago and injured her right foot. Pt states that she is bearing weight more on the right foot then the left.

## 2020-01-04 NOTE — ED Provider Notes (Signed)
[facility]    CSN: [id number] Arrival date & time: 01/04/20  1017      History   Chief Complaint Chief Complaint  Patient presents with   Foot Pain   Knee Pain    left    HPI Anna Pugh is a 49 y.o. female.   Anna Pugh presents with complaints of foot and knee pain. Two weeks ago she fell, which resulted in right lateral foot pain. Went to another urgent care on 8/13 and had xray to right foot, no fracture, told she sprained the foot. Pain still to right lateral foot, at 5th metatarsal. A month ago also developed posterior left knee pain. She has been walking a lot more recently. Left knee pain is now worsening. Pain to ball of her foot now as well. Has been wearing a right ankle brace as well as a left knee strap. Has been taking naproxen once a day which does seem to help with her foot pain. Bruising to right foot has improved, and pain has improved- was constant pain and now pain only with activity/ wb. Pain with left knee extension. Has been applying ice. The more active she is the better the knee feels, but after working on her feet all day the end of day with increased pain.    ROS per HPI, negative if not otherwise mentioned.      Past Medical History:  Diagnosis Date   GERD (gastroesophageal reflux disease)    Localization-related (focal) (partial) epilepsy and epileptic syndromes with complex partial seizures, without mention of intractable epilepsy 01/04/2013   Memory deficit    Migraine without aura, without mention of intractable migraine without mention of status migrainosus 01/04/2013   Obesity    Seizures (HCC)     Patient Active Problem List   Diagnosis Date Noted   Fatigue 10/09/2018   Advice given about COVID-19 virus by telephone 10/09/2018   Partial epilepsy with impairment of consciousness (HCC) 01/04/2013   Migraine without aura 01/04/2013    Past Surgical History:  Procedure Laterality Date   TONSILLECTOMY      OB  History    Gravida  0   Para  0   Term  0   Preterm  0   AB  0   Living  0     SAB  0   TAB  0   Ectopic  0   Multiple  0   Live Births  0            Home Medications    Prior to Admission medications   Medication Sig Start Date End Date Taking? Authorizing Provider  carbamazepine ([name]) 200 MG 12 hr capsule Take 1 capsule (200mg ) in the am and 3 capsules (600mg ) in the evening 01/01/20   Lomax, Latoria, NP  carbamazepine (TEGRETOL-XR) 100 MG 12 hr tablet Take 2 tablets (200 mg total) by mouth every morning. 01/01/20   Lomax, Adelin, NP  cetirizine (ZYRTEC) 10 MG tablet Take 10 mg by mouth daily.    [provider]  cholecalciferol (VITAMIN D) 1000 units tablet Take 1,000 Units by mouth daily.    [provider]  lacosamide (VIMPAT) 50 MG TABS tablet Take 1 tablet (50 mg total) by mouth 2 (two) times daily. 10/07/19   01/03/20, MD  Pediatric Multiple Vit-C-FA (PEDIATRIC MULTIVITAMIN) chewable tablet Chew 1 tablet by mouth daily.    [provider]  rizatriptan (MAXALT) 10 MG tablet Take 1 tablet (10 mg  total) by mouth 3 (three) times daily as needed for migraine. May repeat in 2 hours if needed 08/27/19   Shawnie Dapper, NP    Family History Family History  Problem Relation Age of Onset   Migraines Father    Myasthenia gravis Father    Cancer Brother     Social History Social History   Tobacco Use   Smoking status: Never Smoker   Smokeless tobacco: Never Used  Substance Use Topics   Alcohol use: No   Drug use: No     Allergies   Keppra [levetiracetam], Lamotrigine, Topamax [topiramate], and Azithromycin   Review of Systems Review of Systems   Physical Exam Triage Vital Signs ED Triage Vitals  Enc Vitals Group     BP 01/04/20 1101 115/76     Pulse Rate 01/04/20 1101 64     Resp 01/04/20 1101 16     Temp 01/04/20 1101 98.4 F (36.9 C)     Temp Source 01/04/20 1101 Oral     SpO2 01/04/20 1101 98 %      Weight --      Height --      Head Circumference --      Peak Flow --      Pain Score 01/04/20 1110 6     Pain Loc --      Pain Edu? --      Excl. in GC? --    No data found.  Updated Vital Signs BP 115/76 (BP Location: Right Arm)    Pulse 64    Temp 98.4 F (36.9 C) (Oral)    Resp 16    SpO2 98%   Visual Acuity Right Eye Distance:   Left Eye Distance:   Bilateral Distance:    Right Eye Near:   Left Eye Near:    Bilateral Near:     Physical Exam Constitutional:      General: She is not in acute distress.    Appearance: She is well-developed.  Cardiovascular:     Rate and Rhythm: Normal rate.  Pulmonary:     Effort: Pulmonary effort is normal.  Musculoskeletal:     Left knee: No swelling or bony tenderness. Decreased range of motion. Tenderness present.     Right foot: Normal range of motion. Tenderness and bony tenderness present.     Comments: Mild right lateral foot pain, 5th metatarsal without redness or warmth; cap refill < 2 seconds and gross sensation intact; left posterior knee with point tenderness; mild pain with flexion and extension, no obvious laxity; ambulatory   Skin:    General: Skin is warm and dry.  Neurological:     Mental Status: She is alert and oriented to person, place, and time.      UC Treatments / Results  Labs (all labs ordered are listed, but only abnormal results are displayed) Labs Reviewed - No data to display  EKG   Radiology No results found.  Procedures Procedures (including critical care time)  Medications Ordered in UC Medications - No data to display  Initial Impression / Assessment and Plan / UC Course  I have reviewed the triage vital signs and the nursing notes.  Pertinent labs & imaging results that were available during my care of the patient were reviewed by me and considered in my medical decision making (see chart for details).     Knee strain, likely from increased activity as well as right foot pain from  recent injiury. Knee sleeve placed. Encouraged  follow up with ortho and/or sports medicine for furhter imaging as needed. Patient verbalized understanding and agreeable to plan.   Final Clinical Impressions(s) / UC Diagnoses   Final diagnoses:  Acute pain of left knee  Sprain of right foot, subsequent encounter     Discharge Instructions     Activity as tolerated.  Ice application to foot as well as to knee, especially after activity.  Naproxen twice a day, take with food.  See knee exercises provided. Perform as able.  Knee sleeve as needed for comfort, primarily with activity.  Follow up with sports medicine or orthopedics for further evaluation if symptoms persist or continue to worsen.     ED Prescriptions    None     PDMP not reviewed this encounter.   Georgetta Haber, NP 01/06/20 1225

## 2020-01-09 ENCOUNTER — Telehealth: Payer: Self-pay | Admitting: Family Medicine

## 2020-01-09 NOTE — Telephone Encounter (Signed)
I called pt Anna Pugh and relayed that from records last seizure listed 08-08-19.  [place] states 6 month from last sz then drive so that would be 09[birth year]07[birth year].  Pt is to call back if needed.

## 2020-01-09 NOTE — Telephone Encounter (Signed)
Pt called wanting to know that if it is deemed that she can start driving herself to work she will need a letter stating that for her job. Please advise.

## 2020-01-13 ENCOUNTER — Encounter: Payer: Self-pay | Admitting: *Deleted

## 2020-01-13 ENCOUNTER — Ambulatory Visit (INDEPENDENT_AMBULATORY_CARE_PROVIDER_SITE_OTHER): Payer: 59 | Admitting: Family Medicine

## 2020-01-13 ENCOUNTER — Telehealth: Payer: Self-pay | Admitting: Family Medicine

## 2020-01-13 ENCOUNTER — Encounter: Payer: Self-pay | Admitting: Family Medicine

## 2020-01-13 VITALS — BP 113/78 | HR 56 | Ht 62.0 in | Wt 220.0 lb

## 2020-01-13 DIAGNOSIS — G40209 Localization-related (focal) (partial) symptomatic epilepsy and epileptic syndromes with complex partial seizures, not intractable, without status epilepticus: Secondary | ICD-10-CM | POA: Diagnosis not present

## 2020-01-13 DIAGNOSIS — G43009 Migraine without aura, not intractable, without status migrainosus: Secondary | ICD-10-CM

## 2020-01-13 NOTE — Progress Notes (Signed)
PATIENT: Anna Pugh DOB: 1971/04/01  REASON FOR VISIT: follow up HISTORY FROM: patient  Chief Complaint  Patient presents with  . Follow-up    Rm 2  . Seizures    Pt said she is not having any new sx or episodes.     HISTORY OF PRESENT ILLNESS: Today 01/13/20 Anna Pugh is a 49 y.o. female here today for follow up for seizures. Last event was on 08/08/2019. She is doing well. She denies recent seizure activity. She is tolerating Vimpat and carbamazepine. She was seen in the ER for a migraine on 11/03/2019. Labs were stable. She reports migraines are stable. She continues acute management with rizatriptan. She continues to work as a Engineer, structural. She is inquiring about being able to drive.   Geophysical data processor: Anna Pugh [phone] (fax)   HISTORY: (copied from my note on 08/08/2019)  Anna Pugh is a 49 y.o. female here today for follow up. She reports concerns of having a seizure today. She was driving this to work around 8am. She reports that she dropped her head and lost consciousness for "a few seconds." She does not think she swerved. She did not have an accident. No convulsive activity. No biting of tongue or incontinence. She reports numbness of lower extremities started after seizure. She was confused but was able to call a friend to pick her up. The friend presents with her today and stats that she seemed completely normal when when picked her up. She reports forgetting to take her medication this morning. She also forgot to take morning medications once last week. She does not usually have any difficulty taking her medications. This was typical for her with previous seizures. She denies headaches. She had a mild headache after seizure but states she has not had any migraines in years. Last seizure was about two years ago.   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 She reports that she has been very anxious about working. She has  been worried about her patient getting Covid or having another seizure. She has not slept well recently. She has a cat that wakes her up at night. She is able to care for herself. No difficulty with driving.   HISTORY: (copied from my note on 01/09/2019)  Anna Pugh a 49 y.o.femalehere today for follow up. She has been taking 1 capsule of [name] 200mg  and 3 tablets of Tegretol XR 100mg  for a total of 500mg  in the am and 3 capsules of [name] in the evenings. She has not had any seizure activity or migraines in the past year. She is living alone and able to perform ADL's. She is working full time. She is able to drive without difficulty. She is feeling well today.   HISTORY: (copied fromMegan Millikan'snote on 01/22/2018)  Anna Pugh a 49 year old female with a history of seizures and migraine headaches. She returns today for follow-up. She reports that she has been doing very well. Not had any seizure events and no migraines in the last year. She reports that her current dose of [name] is working well for her. She is able to complete all ADLs independently. She operates a 03/24/2018 without difficulty. Denies any changes in her gait or balance. She returns today for evaluation.  HISTORYMs.Pugh a 49 year old left-handed white female with a history of seizures and migraine headaches. The patient has done quite well with both issues, she has not had any migraine headaches since last seen one year  ago, she has not had any recurring seizures. She is on maximum doses of [name], she is tolerating the medication well. She denies drowsiness or gait instability. She is not on vitamin D supplementation. She returns for routine reevaluation. No other significant medical issues have come up since last seen.   REVIEW OF SYSTEMS: Out of a complete 14 system review of symptoms, the patient complains only of the following symptoms, headaches  and all other reviewed systems  are negative.  ALLERGIES: Allergies  Allergen Reactions  . Keppra [Levetiracetam] Rash  . Lamotrigine Rash  . Topamax [Topiramate] Other (See Comments)    "made me feel so bad"  . Azithromycin Other (See Comments)    Disoriented and out of breath     HOME MEDICATIONS: Outpatient Medications Prior to Visit  Medication Sig Dispense Refill  . carbamazepine ([name]) 200 MG 12 hr capsule Take 1 capsule (200mg ) in the am and 3 capsules (600mg ) in the evening 360 capsule 3  . carbamazepine (TEGRETOL-XR) 100 MG 12 hr tablet Take 2 tablets (200 mg total) by mouth every morning. 180 tablet 3  . cetirizine (ZYRTEC) 10 MG tablet Take 10 mg by mouth daily.    . cholecalciferol (VITAMIN D) 1000 units tablet Take 1,000 Units by mouth daily.    lacosamide (VIMPAT) 50 MG TABS tablet Take 1 tablet (50 mg total) by mouth 2 (two) times daily. 180 tablet 1  . Pediatric Multiple Vit-C-FA (PEDIATRIC MULTIVITAMIN) chewable tablet Chew 1 tablet by mouth daily.    . rizatriptan (MAXALT) 10 MG tablet Take 1 tablet (10 mg total) by mouth 3 (three) times daily as needed for migraine. May repeat in 2 hours if needed 12 tablet 3   No facility-administered medications prior to visit.    PAST MEDICAL HISTORY: Past Medical History:  Diagnosis Date  . GERD (gastroesophageal reflux disease)   . Localization-related (focal) (partial) epilepsy and epileptic syndromes with complex partial seizures, without mention of intractable epilepsy 01/04/2013  . Memory deficit   . Migraine without aura, without mention of intractable migraine without mention of status migrainosus 01/04/2013  . Obesity   . Seizures (HCC)     PAST SURGICAL HISTORY: Past Surgical History:  Procedure Laterality Date  . TONSILLECTOMY      FAMILY HISTORY: Family History  Problem Relation Age of Onset  . Migraines Father   . Myasthenia gravis Father   . Cancer Brother     SOCIAL HISTORY: Social History   Socioeconomic History  .  Marital status: Single    Spouse name: Not on file  . Number of children: 0  . Years of education: Bachelor  . Highest education level: Not on file  Occupational History    Comment: Employed by 01/06/2013  Tobacco Use  . Smoking status: Never Smoker  . Smokeless tobacco: Never Used  Substance and Sexual Activity  . Alcohol use: No  . Drug use: No  . Sexual activity: Not Currently  Other Topics Concern  . Not on file  Social History Narrative   Patient is single with no children.   Patient is left handed.   Patient has a Bachelor's degree.   Patient drinks 3 cups daily.   Social Determinants of Health   Financial Resource Strain:   . Difficulty of Paying Living Expenses: Not on file  Food Insecurity:   . Worried About 01/06/2013 in the Last Year: Not on file  . Ran Out of Food in the Last  Year: Not on file  Transportation Needs:   . Lack of Transportation (Medical): Not on file  . Lack of Transportation (Non-Medical): Not on file  Physical Activity:   . Days of Exercise per Week: Not on file  . Minutes of Exercise per Session: Not on file  Stress:   . Feeling of Stress : Not on file  Social Connections:   . Frequency of Communication with Friends and Family: Not on file  . Frequency of Social Gatherings with Friends and Family: Not on file  . Attends Religious Services: Not on file  . Active Member of Clubs or Organizations: Not on file  . Attends Banker Meetings: Not on file  . Marital Status: Not on file  Intimate Partner Violence:   . Fear of Current or Ex-Partner: Not on file  . Emotionally Abused: Not on file  . Physically Abused: Not on file  . Sexually Abused: Not on file      PHYSICAL EXAM  Vitals:   01/13/20 0743  BP: 113/78  Pulse: (!) 56  Weight: 220 lb (99.8 kg)  Height: 5\' 2"  (1.575 m)   Body mass index is 40.24 kg/m.  Generalized: Well developed, in no acute distress  Cardiology: normal rate and rhythm, no murmur  noted Respiratory: clear to auscultation bilaterally  Neurological examination  Mentation: Alert oriented to time, place, history taking. Follows all commands speech and language fluent Cranial nerve II-XII: Pupils were equal round reactive to light. Extraocular movements were full, visual field were full Motor: The motor testing reveals 5 over 5 strength of all 4 extremities. Good symmetric motor tone is noted throughout.  Gait and station: Gait is normal.     DIAGNOSTIC DATA (LABS, IMAGING, TESTING) - I reviewed patient records, labs, notes, testing and imaging myself where available.  No flowsheet data found.   Lab Results  Component Value Date   WBC 9.1 11/03/2019   HGB 13.3 11/03/2019   HCT 41.0 11/03/2019   MCV 89.3 11/03/2019   PLT 323 11/03/2019      Component Value Date/Time   NA 141 11/03/2019 1547   NA 143 08/08/2019 1404   K 4.1 11/03/2019 1547   CL 107 11/03/2019 1547   CO2 27 11/03/2019 1547   GLUCOSE 105 (H) 11/03/2019 1547   BUN 17 11/03/2019 1547   BUN 11 08/08/2019 1404   CREATININE 0.66 11/03/2019 1547   CALCIUM 8.8 (L) 11/03/2019 1547   PROT 7.3 08/08/2019 1404   ALBUMIN 4.2 08/08/2019 1404   AST 24 08/08/2019 1404   ALT 20 08/08/2019 1404   ALKPHOS 115 08/08/2019 1404   BILITOT <0.2 08/08/2019 1404   GFRNONAA >60 11/03/2019 1547   GFRAA >60 11/03/2019 1547   Lab Results  Component Value Date   CHOL 218 (H) 01/14/2019   HDL 63 01/14/2019   LDLCALC 131 (H) 01/14/2019   TRIG 134 01/14/2019   CHOLHDL 3.5 01/14/2019   No results found for: HGBA1C Lab Results  Component Value Date   VITAMINB12 447 01/04/2013   Lab Results  Component Value Date   TSH 2.790 01/04/2013     ASSESSMENT AND PLAN 49 y.o. year old female  has a past medical history of GERD (gastroesophageal reflux disease), Localization-related (focal) (partial) epilepsy and epileptic syndromes with complex partial seizures, without mention of intractable epilepsy (01/04/2013),  Memory deficit, Migraine without aura, without mention of intractable migraine without mention of status migrainosus (01/04/2013), Obesity, and Seizures (HCC). here with  ICD-10-CM   1. Partial epilepsy with impairment of consciousness (HCC)  G40.209   2. Migraine without aura and without status migrainosus, not intractable  G43.009     Shemeika is doing well, today. She denies recent seizure activity. She is tolerating Vimpat and [name]/Tegretol ER. We will continue [name] 200 mg in the morning and 600 mg in the evening as well as Tegretol ER 200 mg every morning. She will also continue Vimpat 50mg  BID. She will continue rizatriptan as needed for abortive therapy. She was encouraged to stay well hydrated. Well balanced diet and regular exercise encouraged. She may resume driving privileges, assuming no further seizure events, on 02/08/2020. We will send a letter to her employer per her request stating this. She will follow up with me in 6 months, sooner if needed.    No orders of the defined types were placed in this encounter.    No orders of the defined types were placed in this encounter.     I spent 15 minutes with the patient. 50% of this time was spent counseling and educating patient on plan of care and medications.    Shawnie Dappermy Alliyah Roesler, FNP-C 01/13/2020, 8:36 AM [facility] [address], [address] [place], [place] [address] [phone]

## 2020-01-13 NOTE — Telephone Encounter (Signed)
Letter faxed to Julaine Fusi [phone] with fax confirmation received.

## 2020-01-13 NOTE — Telephone Encounter (Signed)
Letter completed and to Shawnie Dapper, NP to sign then will fax.

## 2020-01-13 NOTE — Telephone Encounter (Signed)
I called pt.  I relayed did have letter to be signed then will fax.  Prescriptions done 01-01-20 90 day supply at [facility]. She should be good to go, ask pharmacy about prescription on file.  She will.

## 2020-01-13 NOTE — Telephone Encounter (Signed)
See other note

## 2020-01-13 NOTE — Telephone Encounter (Signed)
Pt called stating that she is needing to speak to the RN to elaborate more on the phone call they just had. Pt states she was on the bus and could not get in to details. Please advise.

## 2020-01-13 NOTE — Telephone Encounter (Signed)
Called pt and she stated she just saw Yaneli,NP to see about driving.  That was good I relayed sorry for the extra call.  She was appreciative of call.

## 2020-01-13 NOTE — Progress Notes (Signed)
I have read the note, and I agree with the clinical assessment and plan.  Quency Tober K Alesana Magistro   

## 2020-01-13 NOTE — Patient Instructions (Addendum)
We will continue [name] 200 mg in the morning and 600 mg in the evening as well as Tegretol ER 200 mg every morning. She will also continue Vimpat 50mg  twice daily.   According to [place] law, you can not drive unless you are seizure / syncope free for at least 6 months and under physician's care. Your last event was 3/25. You may resume driving on 4/25 assuming you do not have any more seizure activity.   Please maintain precautions. Do not participate in activities where a loss of awareness could harm you or someone else. No swimming alone, no tub bathing, no hot tubs, no driving, no operating motorized vehicles (cars, ATVs, motocycles, etc), lawnmowers, power tools or firearms. No standing at heights, such as rooftops, ladders or stairs. Avoid hot objects such as stoves, heaters, open fires. Wear a helmet when riding a bicycle, scooter, skateboard, etc. and avoid areas of traffic. Set your water heater to 120 degrees or less.    Stay well hydrated. Well balanced diet and regular exercise.    Follow up in 6 months, sooner if needed    Seizure, Adult A seizure is a sudden burst of abnormal electrical activity in the brain. Seizures usually last from 30 seconds to 2 minutes. They can cause many different symptoms. Usually, seizures are not harmful unless they last a long time. What are the causes? Common causes of this condition include:  Fever or infection.  Conditions that affect the brain, such as: ? A brain abnormality that you were born with. ? A brain or head injury. ? Bleeding in the brain. ? A tumor. ? Stroke. ? Brain disorders such as autism or cerebral palsy.  Low blood sugar.  Conditions that are passed from parent to child (are inherited).  Problems with substances, such as: ? Having a reaction to a drug or a medicine. ? Suddenly stopping the use of a substance (withdrawal). In some cases, the cause may not be known. A person who has repeated seizures over time without  a clear cause has a condition called epilepsy. What increases the risk? You are more likely to get this condition if you have:  A family history of epilepsy.  Had a seizure in the past.  A brain disorder.  A history of head injury, lack of oxygen at birth, or strokes. What are the signs or symptoms? There are many types of seizures. The symptoms vary depending on the type of seizure you have. Examples of symptoms during a seizure include:  Shaking (convulsions).  Stiffness in the body.  Passing out (losing consciousness).  Head nodding.  Staring.  Not responding to sound or touch.  Loss of bladder control and bowel control. Some people have symptoms right before and right after a seizure happens. Symptoms before a seizure may include:  Fear.  Worry (anxiety).  Feeling like you may vomit (nauseous).  Feeling like the room is spinning (vertigo).  Feeling like you saw or heard something before (dj vu).  Odd tastes or smells.  Changes in how you see. You may see flashing lights or spots. Symptoms after a seizure happens can include:  Confusion.  Sleepiness.  Headache.  Weakness on one side of the body. How is this treated? Most seizures will stop on their own in under 5 minutes. In these cases, no treatment is needed. Seizures that last longer than 5 minutes will usually need treatment. Treatment can include:  Medicines given through an IV tube.  Avoiding things that are  known to cause your seizures. These can include medicines that you take for another condition.  Medicines to treat epilepsy.  Surgery to stop the seizures. This may be needed if medicines do not help. Follow these instructions at home: Medicines  Take over-the-counter and prescription medicines only as told by your doctor.  Do not eat or drink anything that may keep your medicine from working, such as alcohol. Activity  Do not do any activities that would be dangerous if you had  another seizure, like driving or swimming. Wait until your doctor says it is safe for you to do them.  If you live in the [place] ask your local DMV (department of motor vehicles) when you can drive.  Get plenty of rest. Teaching others Teach friends and family what to do when you have a seizure. They should:  Lay you on the ground.  Protect your head and body.  Loosen any tight clothing around your neck.  Turn you on your side.  Not hold you down.  Not put anything into your mouth.  Know whether or not you need emergency care.  Stay with you until you are better.  General instructions  Contact your doctor each time you have a seizure.  Avoid anything that gives you seizures.  Keep a seizure diary. Write down: ? What you think caused each seizure. ? What you remember about each seizure.  Keep all follow-up visits as told by your doctor. This is important. Contact a doctor if:  You have another seizure.  You have seizures more often.  There is any change in what happens during your seizures.  You keep having seizures with treatment.  You have symptoms of being sick or having an infection. Get help right away if:  You have a seizure that: ? Lasts longer than 5 minutes. ? Is different than seizures you had before. ? Makes it harder to breathe. ? Happens after you hurt your head.  You have any of these symptoms after a seizure: ? Not being able to speak. ? Not being able to use a part of your body. ? Confusion. ? A bad headache.  You have two or more seizures in a row.  You do not wake up right after a seizure.  You get hurt during a seizure. These symptoms may be an emergency. Do not wait to see if the symptoms will go away. Get medical help right away. Call your local emergency services (911 in the [place] Do not drive yourself to the hospital. Summary  Seizures usually last from 30 seconds to 2 minutes. Usually, they are not harmful unless they last a  long time.  Do not eat or drink anything that may keep your medicine from working, such as alcohol.  Teach friends and family what to do when you have a seizure.  Contact your doctor each time you have a seizure. This information is not intended to replace advice given to you by your health care provider. Make sure you discuss any questions you have with your health care provider. Document Revised: 07/20/2018 Document Reviewed: 07/20/2018 Elsevier Patient Education  2020 [employer].

## 2020-01-13 NOTE — Telephone Encounter (Signed)
Can you please write a letter to her employer stating that she is doing well on current medication regimen. No seizure activity since 08/08/2019. She may resume driving privileges on 1/16, assuming no further seizure activity occurs before then. AttnJulaine Fusi [phone] (fax). TY!

## 2020-01-16 ENCOUNTER — Encounter: Payer: 59 | Admitting: Family Medicine

## 2020-03-23 ENCOUNTER — Other Ambulatory Visit: Payer: Self-pay | Admitting: Neurology

## 2020-03-23 DIAGNOSIS — G40209 Localization-related (focal) (partial) symptomatic epilepsy and epileptic syndromes with complex partial seizures, not intractable, without status epilepticus: Secondary | ICD-10-CM

## 2020-07-13 ENCOUNTER — Encounter: Payer: Self-pay | Admitting: Family Medicine

## 2020-07-13 ENCOUNTER — Telehealth (INDEPENDENT_AMBULATORY_CARE_PROVIDER_SITE_OTHER): Payer: 59 | Admitting: Family Medicine

## 2020-07-13 DIAGNOSIS — G43009 Migraine without aura, not intractable, without status migrainosus: Secondary | ICD-10-CM

## 2020-07-13 DIAGNOSIS — G40209 Localization-related (focal) (partial) symptomatic epilepsy and epileptic syndromes with complex partial seizures, not intractable, without status epilepticus: Secondary | ICD-10-CM | POA: Diagnosis not present

## 2020-07-13 MED ORDER — RIZATRIPTAN BENZOATE 10 MG PO TABS: 10.0000 mg | ORAL_TABLET | Freq: Three times a day (TID) | ORAL | 3 refills | Status: DC | PRN

## 2020-07-13 MED ORDER — CARBAMAZEPINE ER 200 MG PO CP12: ORAL_CAPSULE | ORAL | 3 refills | Status: DC

## 2020-07-13 NOTE — Progress Notes (Signed)
I have read the note, and I agree with the clinical assessment and plan.  Charles K Willis   

## 2020-07-13 NOTE — Progress Notes (Signed)
PATIENT: Anna Pugh DOB: 10/26/1970  REASON FOR VISIT: follow up HISTORY FROM: patient  Virtual Visit via Telephone Note  I connected with Anna Pugh on 07/13/20 at  9:30 AM EST by telephone and verified that I am speaking with the correct person using two identifiers.   I discussed the limitations, risks, security and privacy concerns of performing an evaluation and management service by telephone and the availability of in person appointments. I also discussed with the patient that there may be a patient responsible charge related to this service. The patient expressed understanding and agreed to proceed.   History of Present Illness:  07/13/20 ALL:  Anna Pugh is a 50 y.o. female here today for follow up for seizures and migraines. She continues lacosamide 50mg  BID and carbamazepine 200mg  in am and 600mg  in pm. Carbamazepine dose previously 500mg  and 600mg  but changed during 2020. She is not sure when or why. She has been stable on current AED dose. No seizure activity in the past year. She has not had any migraines. Rizatriptan works well for abortive therapy.   She is in with her dad who is having some health problems. She is not working right now.    01/13/20 ALL:  Anna Pugh is a 50 y.o. female here today for follow up for seizures. Last event was on 08/08/2019. She is doing well. She denies recent seizure activity. She is tolerating Vimpat and carbamazepine. She was seen in the ER for a migraine on 11/03/2019. Labs were stable. She reports migraines are stable. She continues acute management with rizatriptan. She continues to work as a 01/15/20. She is inquiring about being able to drive.   Human Resources manager: Anna Pugh [phone] (fax)   HISTORY: (copied from my note on 08/08/2019)  Anna Steineris a 50 y.o.femalehere today for follow up. She reports concerns of having a seizure today. She was driving this to work around 8am. She reports that she dropped  her head and lost consciousness for "a few seconds." She does not think she swerved. She did not have an accident. No convulsive activity. No biting of tongue or incontinence. She reports numbness of lower extremities started after seizure. She was confused but was able to call a friend to pick her up. The friend presents with her today and stats that she seemed completely normal when when picked her up. She reports forgetting to take her medication this morning. She also forgot to take morning medications once last week. She does not usually have any difficulty taking her medications. This was typical for her with previous seizures. She denies headaches. She had a mild headache after seizure but states she has not had any migraines in years. Last seizure was about two years ago.   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 She reports that she has been very anxious about working. She has been worried about her patient getting Covid or having another seizure. She has not slept well recently. She has a cat that wakes her up at night. She is able to care for herself. No difficulty with driving.  HISTORY: (copied frommynote on 01/09/2019)  Anna Steineris a 50 y.o.femalehere today for follow up. She has been taking 1 capsule of [name] 200mg  and 3 tablets of Tegretol XR 100mg  for a total of 500mg  in the am and 3 capsules of [name] in the evenings. She has not had any seizure activity or migraines in the past year. She is  living alone and able to perform ADL's. She is working full time. She is able to drive without difficulty. She is feeling well today.   HISTORY: (copied fromMegan Pugh'snote on 01/22/2018)  Anna Pugh a 50 year old female with a history of seizures and migraine headaches. She returns today for follow-up. She reports that she has been doing very well. Not had any seizure events and no migraines in the last year. She reports that her  current dose of [name] is working well for her. She is able to complete all ADLs independently. She operates a Librarian, academic without difficulty. Denies any changes in her gait or balance. She returns today for evaluation.  HISTORYMs.Steineris a 50 year old left-handed white female with a history of seizures and migraine headaches. The patient has done quite well with both issues, she has not had any migraine headaches since last seen one year ago, she has not had any recurring seizures. She is on maximum doses of [name], she is tolerating the medication well. She denies drowsiness or gait instability. She is not on vitamin D supplementation. She returns for routine reevaluation. No other significant medical issues have come up since last seen.   Observations/Objective:  Generalized: Well developed, in no acute distress  Mentation: Alert oriented to time, place, history taking. Follows all commands speech and language fluent   Assessment and Plan:  50 y.o. year old female  has a past medical history of GERD (gastroesophageal reflux disease), Localization-related (focal) (partial) epilepsy and epileptic syndromes with complex partial seizures, without mention of intractable epilepsy (01/04/2013), Memory deficit, Migraine without aura, without mention of intractable migraine without mention of status migrainosus (01/04/2013), Obesity, and Seizures (HCC). here with  No diagnosis found.   Anna Pugh is doing well on lacosamide 50mg  BID and carbamazepine 400mg  in am and 600mg  in pm. Lacosamide provided through patient assistance. We will discontinue carbamazepine 100mg  tablets and continue [name] capsules. She is aware of change and abel to verbalize instructions using teach back. She may continue rizatriptan as needed. Healthy lifestyle habits encouraged. Seizure precautions advised.  She will follow up with me in 1 year, sooner if needed. She verbalizes understanding and agreement with this  plan.   No orders of the defined types were placed in this encounter.   No orders of the defined types were placed in this encounter.    Follow Up Instructions:  I discussed the assessment and treatment plan with the patient. The patient was provided an opportunity to ask questions and all were answered. The patient agreed with the plan and demonstrated an understanding of the instructions.   The patient was advised to call back or seek an in-person evaluation if the symptoms worsen or if the condition fails to improve as anticipated.  I provided 20 minutes of non-face-to-face time during this encounter. Patient located at their place of residence during Mychart visit. Provider is in the office.    , NP

## 2020-07-13 NOTE — Patient Instructions (Signed)
Below is our plan:  We will continue lacosamide 50mg  twice daily. Discontinue Tegretol XR 100mg  tablets once you have completed current supply. We will continue [name] (carbamazepine) 200mg  capsules. Continue carbamazepine dose of 400mg  in the morning and 600mg  in the evenings. New prescription sent to [facility].   Please make sure you are staying well hydrated. I recommend 50-60 ounces daily. Well balanced diet and regular exercise encouraged. Consistent sleep schedule with 6-8 hours recommended.   Please continue follow up with care team as directed.   Follow up with me in 1 year   You may receive a survey regarding today's visit. I encourage you to leave honest feed back as I do use this information to improve patient care. Thank you for seeing me today!      Seizure, Adult A seizure is a sudden burst of abnormal electrical and chemical activity in the brain. Seizures usually last from 30 seconds to 2 minutes.  What are the causes? Common causes of this condition include:  Fever or infection.  Problems that affect the brain. These may include: ? A brain or head injury. ? Bleeding in the brain. ? A brain tumor.  Low levels of blood sugar or salt.  Kidney problems or liver problems.  Conditions that are passed from parent to child (are inherited).  Problems with a substance, such as: ? Having a reaction to a drug or a medicine. ? Stopping the use of a substance all of a sudden (withdrawal).  A stroke.  Disorders that affect how you develop. Sometimes, the cause may not be known.  What increases the risk?  Having someone in your family who has epilepsy. In this condition, seizures happen again and again over time. They have no clear cause.  Having had a tonic-clonic seizure before. This type of seizure causes you to: ? Tighten the muscles of the whole body. ? Lose consciousness.  Having had a head injury or strokes before.  Having had a lack of oxygen at  birth. What are the signs or symptoms? There are many types of seizures. The symptoms vary depending on the type of seizure you have. Symptoms during a seizure  Shaking that you cannot control (convulsions) with fast, jerky movements of muscles.  Stiffness of the body.  Breathing problems.  Feeling mixed up (confused).  Staring or not responding to sound or touch.  Head nodding.  Eyes that blink, flutter, or move fast.  Drooling, grunting, or making clicking sounds with your mouth  Losing control of when you pee or poop. Symptoms before a seizure  Feeling afraid, nervous, or worried.  Feeling like you may vomit.  Feeling like: ? You are moving when you are not. ? Things around you are moving when they are not.  Feeling like you saw or heard something before (dj vu).  Odd tastes or smells.  Changes in how you see. You may see flashing lights or spots. Symptoms after a seizure  Feeling confused.  Feeling sleepy.  Headache.  Sore muscles. How is this treated? If your seizure stops on its own, you will not need treatment. If your seizure lasts longer than 5 minutes, you will normally need treatment. Treatment may include:  Medicines given through an IV tube.  Avoiding things, such as medicines, that are known to cause your seizures.  Medicines to prevent seizures.  A device to prevent or control seizures.  Surgery.  A diet low in carbohydrates and high in fat (ketogenic diet). Follow these instructions  at home: Medicines  Take over-the-counter and prescription medicines only as told by your doctor.  Avoid foods or drinks that may keep your medicine from working, such as alcohol. Activity  Follow instructions about driving, swimming, or doing things that would be dangerous if you had another seizure. Wait until your doctor says it is safe for you to do these things.  If you live in the [place] ask your local department of motor vehicles when you can  drive.  Get a lot of rest. Teaching others  Teach friends and family what to do when you have a seizure. They should: ? Help you get down to the ground. ? Protect your head and body. ? Loosen any clothing around your neck. ? Turn you on your side. ? Know whether or not you need emergency care. ? Stay with you until you are better.  Also, tell them what not to do if you have a seizure. Tell them: ? They should not hold you down. ? They should not put anything in your mouth.   General instructions  Avoid anything that gives you seizures.  Keep a seizure diary. Write down: ? What you remember about each seizure. ? What you think caused each seizure.  Keep all follow-up visits. Contact a doctor if:  You have another seizure or seizures. Call the doctor each time you have a seizure.  The pattern of your seizures changes.  You keep having seizures with treatment.  You have symptoms of being sick or having an infection.  You are not able to take your medicine. Get help right away if:  You have any of these problems: ? A seizure that lasts longer than 5 minutes. ? Many seizures in a row and you do not feel better between seizures. ? A seizure that makes it harder to breathe. ? A seizure and you can no longer speak or use part of your body.  You do not wake up right after a seizure.  You get hurt during a seizure.  You feel confused or have pain right after a seizure. These symptoms may be an emergency. Get help right away. Call your local emergency services (911 in the [place]  Do not wait to see if the symptoms will go away.  Do not drive yourself to the hospital. Summary  A seizure is a sudden burst of abnormal electrical and chemical activity in the brain. Seizures normally last from 30 seconds to 2 minutes.  Causes of seizures include illness, injury to the head, low levels of blood sugar or salt, and certain conditions.  Most seizures will stop on their own in  less than 5 minutes. Seizures that last longer than 5 minutes are a medical emergency and need treatment right away.  Many medicines are used to treat seizures. Take over-the-counter and prescription medicines only as told by your doctor. This information is not intended to replace advice given to you by your health care provider. Make sure you discuss any questions you have with your health care provider. Document Revised: 11/08/2019 Document Reviewed: 11/08/2019 Elsevier Patient Education  2021 [employer].

## 2020-11-03 ENCOUNTER — Other Ambulatory Visit: Payer: Self-pay | Admitting: Neurology

## 2020-11-03 DIAGNOSIS — G40209 Localization-related (focal) (partial) symptomatic epilepsy and epileptic syndromes with complex partial seizures, not intractable, without status epilepticus: Secondary | ICD-10-CM

## 2021-04-18 ENCOUNTER — Encounter (HOSPITAL_BASED_OUTPATIENT_CLINIC_OR_DEPARTMENT_OTHER): Payer: Self-pay | Admitting: *Deleted

## 2021-04-18 ENCOUNTER — Other Ambulatory Visit: Payer: Self-pay

## 2021-04-18 ENCOUNTER — Emergency Department (HOSPITAL_BASED_OUTPATIENT_CLINIC_OR_DEPARTMENT_OTHER)
Admission: EM | Admit: 2021-04-18 | Discharge: 2021-04-19 | Disposition: A | Discharge status: Home / Self Care | Payer: 59 | Attending: Emergency Medicine | Admitting: Emergency Medicine

## 2021-04-18 ENCOUNTER — Ambulatory Visit (HOSPITAL_COMMUNITY)
Admission: RE | Admit: 2021-04-18 | Discharge: 2021-04-18 | Disposition: A | Discharge status: Home / Self Care | Payer: 59 | Source: Ambulatory Visit | Attending: Physician Assistant | Admitting: Physician Assistant

## 2021-04-18 ENCOUNTER — Encounter (HOSPITAL_COMMUNITY): Payer: Self-pay

## 2021-04-18 VITALS — BP 121/85 | HR 77 | Temp 98.1°F | Resp 22

## 2021-04-18 DIAGNOSIS — M5441 Lumbago with sciatica, right side: Secondary | ICD-10-CM

## 2021-04-18 DIAGNOSIS — M79604 Pain in right leg: Secondary | ICD-10-CM | POA: Diagnosis present

## 2021-04-18 DIAGNOSIS — W19XXXA Unspecified fall, initial encounter: Secondary | ICD-10-CM | POA: Diagnosis not present

## 2021-04-18 DIAGNOSIS — M5431 Sciatica, right side: Secondary | ICD-10-CM

## 2021-04-18 MED ORDER — KETOROLAC TROMETHAMINE 30 MG/ML IJ SOLN
INTRAMUSCULAR | Status: AC
  Filled 2021-04-18: qty 1

## 2021-04-18 MED ORDER — TIZANIDINE HCL 4 MG PO CAPS
4.0000 mg | ORAL_CAPSULE | Freq: Three times a day (TID) | ORAL | 0 refills | Status: DC | PRN
Start: 2021-04-18 — End: 2021-08-04

## 2021-04-18 MED ORDER — KETOROLAC TROMETHAMINE 30 MG/ML IJ SOLN
30.0000 mg | Freq: Once | INTRAMUSCULAR | Status: AC
  Administered 2021-04-18: 15:00:00 30 mg via INTRAMUSCULAR

## 2021-04-18 NOTE — ED Triage Notes (Signed)
Patient has a history of back pain.  Patient is complaining of back pain today.  This past week was moving somethings, and did experience a fall as well.  Patient reports there is pain across lower back, sometimes left lower back and now pain in upper thigh.

## 2021-04-18 NOTE — Discharge Instructions (Signed)
We gave you an injection of Toradol which is like ibuprofen.  Please do not take NSAIDs including aspirin, ibuprofen/Tylenol, naproxen/Aleve for 12 to 24 hours.  You can use Tylenol for pain relief.  After the 24 hours you can alternate Tylenol and ibuprofen.  Use Zanaflex up to 3 times a day.  This make you sleepy do not drive or drink alcohol while taking it.  Use heat, rest, gentle stretch for symptom relief.  Follow-up with sports medicine to consider referral to physical therapy as we discussed.  If you have any worsening symptoms including increased pain, difficulty walking, going to the bathroom on yourself without noticing it you need to go to the ER.

## 2021-04-18 NOTE — ED Provider Notes (Signed)
[facility]    CSN: [id number] Arrival date & time: 04/18/21  1324      History   Chief Complaint Chief Complaint  Patient presents with   Appointment    2:00pm   Back Pain    HPI Anna Pugh is a 50 y.o. female.   Patient presents today with a weeklong history of recurrent right-sided lower back pain with radiation to his right leg.  Reports that approximately 10 years ago she was moving a patient when she developed back pain.  She has had intermittent episodes since that time.  Prior to current episode she was helping her mother passed away and move dishes and believes she might of strained something but denies any specific injury.  She did fall several days ago and hit her chin and nose but did not injure her back and reports that back pain had already started prior to this fall.  She denies any loss of consciousness, nausea, vomiting, headache, dizziness.  She has tried ibuprofen without improvement of symptoms.  Reports pain is rated 10 on a 0-10 pain scale, localized to right lower back with radiation into leg, described as intense pressure/vice grip with associated aching, no alleviating factors identified.  She is able to ambulate.  Denies any fever, nausea, vomiting, paresthesias, bowel/bladder incontinence.  Denies previous spinal surgery.  Denies history of malignancy.   Past Medical History:  Diagnosis Date   GERD (gastroesophageal reflux disease)    Localization-related (focal) (partial) epilepsy and epileptic syndromes with complex partial seizures, without mention of intractable epilepsy 01/04/2013   Memory deficit    Migraine without aura, without mention of intractable migraine without mention of status migrainosus 01/04/2013   Obesity    Seizures (HCC)     Patient Active Problem List   Diagnosis Date Noted   Fatigue 10/09/2018   Advice given about COVID-19 virus by telephone 10/09/2018   Partial epilepsy with impairment of consciousness (HCC)  01/04/2013   Migraine without aura 01/04/2013    Past Surgical History:  Procedure Laterality Date   TONSILLECTOMY      OB History     Gravida  0   Para  0   Term  0   Preterm  0   AB  0   Living  0      SAB  0   IAB  0   Ectopic  0   Multiple  0   Live Births  0            Home Medications    Prior to Admission medications   Medication Sig Start Date End Date Taking? Authorizing Provider  tiZANidine (ZANAFLEX) 4 MG capsule Take 1 capsule (4 mg total) by mouth 3 (three) times daily as needed for muscle spasms. 04/18/21  Yes Iosefa Weintraub, Noberto Retort, PA-C  carbamazepine ([name]) 200 MG 12 hr capsule Take 1 capsule (200mg ) in the am and 3 capsules (600mg ) in the evening 07/13/20   Lomax, Vietta, NP  cetirizine (ZYRTEC) 10 MG tablet Take 10 mg by mouth daily.    [provider]  cholecalciferol (VITAMIN D) 1000 units tablet Take 1,000 Units by mouth daily.    [provider]  Pediatric Multiple Vit-C-FA (PEDIATRIC MULTIVITAMIN) chewable tablet Chew 1 tablet by mouth daily.    [provider]  rizatriptan (MAXALT) 10 MG tablet Take 1 tablet (10 mg total) by mouth 3 (three) times daily as needed for migraine. May repeat in 2 hours if needed 07/13/20  Lomax, Norberta, NP  VIMPAT 50 MG TABS tablet Take 1 tablet (50 mg total) by mouth 2 (two) times daily. 11/03/20   York Spaniel, MD    Family History Family History  Problem Relation Age of Onset   Stroke Mother    Hypertension Mother    Migraines Father    Myasthenia gravis Father    Cancer Brother     Social History Social History   Tobacco Use   Smoking status: Never   Smokeless tobacco: Never  Vaping Use   Vaping Use: Never used  Substance Use Topics   Alcohol use: No   Drug use: No     Allergies   Keppra [levetiracetam], Lamotrigine, Topamax [topiramate], and Azithromycin   Review of Systems Review of Systems  Constitutional:  Positive for activity change. Negative for  appetite change, fatigue and fever.  Respiratory:  Negative for cough and shortness of breath.   Cardiovascular:  Negative for chest pain.  Gastrointestinal:  Negative for abdominal pain, diarrhea, nausea and vomiting.  Genitourinary:  Negative for dysuria, frequency and urgency.  Musculoskeletal:  Positive for back pain. Negative for arthralgias and myalgias.  Neurological:  Negative for dizziness, weakness, light-headedness, numbness and headaches.    Physical Exam Triage Vital Signs ED Triage Vitals  Enc Vitals Group     BP 04/18/21 1422 121/85     Pulse Rate 04/18/21 1422 77     Resp 04/18/21 1422 (!) 22     Temp 04/18/21 1422 98.1 F (36.7 C)     Temp Source 04/18/21 1422 Oral     SpO2 04/18/21 1422 97 %     Weight --      Height --      Head Circumference --      Peak Flow --      Pain Score 04/18/21 1419 10     Pain Loc --      Pain Edu? --      Excl. in GC? --    No data found.  Updated Vital Signs BP 121/85 (BP Location: Right Arm)   Pulse 77   Temp 98.1 F (36.7 C) (Oral)   Resp (!) 22   SpO2 97%   Visual Acuity Right Eye Distance:   Left Eye Distance:   Bilateral Distance:    Right Eye Near:   Left Eye Near:    Bilateral Near:     Physical Exam Vitals reviewed.  Constitutional:      General: She is awake. She is not in acute distress.    Appearance: Normal appearance. She is well-developed. She is not ill-appearing.     Comments: Very pleasant female appears stated age walking around exam room obviously uncomfortable but in no acute distress  HENT:     Head: Normocephalic and atraumatic.     Mouth/Throat:     Pharynx: Uvula midline. No oropharyngeal exudate or posterior oropharyngeal erythema.  Cardiovascular:     Rate and Rhythm: Normal rate and regular rhythm.     Heart sounds: Normal heart sounds, S1 normal and S2 normal. No murmur heard. Pulmonary:     Effort: Pulmonary effort is normal.     Breath sounds: Normal breath sounds. No  wheezing, rhonchi or rales.     Comments: Clear to auscultation bilaterally Abdominal:     General: Bowel sounds are normal.     Palpations: Abdomen is soft.     Tenderness: There is no abdominal tenderness. There is no right CVA tenderness, left  CVA tenderness, guarding or rebound.  Musculoskeletal:     Cervical back: No tenderness or bony tenderness.     Thoracic back: No tenderness or bony tenderness.     Lumbar back: Tenderness present. No bony tenderness. Negative right straight leg raise test and negative left straight leg raise test.     Comments: Tenderness to palpation of right lumbar paraspinal muscles.  No pain percussion of vertebrae.  No deformity or step-off noted.  Strength 5/5 bilateral lower extremities.  Psychiatric:        Behavior: Behavior is cooperative.     UC Treatments / Results  Labs (all labs ordered are listed, but only abnormal results are displayed) Labs Reviewed - No data to display  EKG   Radiology No results found.  Procedures Procedures (including critical care time)  Medications Ordered in UC Medications  ketorolac (TORADOL) 30 MG/ML injection 30 mg (has no administration in time range)    Initial Impression / Assessment and Plan / UC Course  I have reviewed the triage vital signs and the nursing notes.  Pertinent labs & imaging results that were available during my care of the patient were reviewed by me and considered in my medical decision making (see chart for details).     No indication for head CT due to recent fall based on [place] CT rules.  No x-rays obtained given lack of recent trauma and no bony tenderness or abnormality on exam.  She was given Toradol in clinic with instruction to avoid NSAIDs for 24 hours.  She can use Tylenol in the meantime for breakthrough pain and then alternate Tylenol and ibuprofen after the 24 hours.  She was given Zanaflex up to 3 times a day as needed for pain.  Discussed that this is sedating and  she should not drive or drink alcohol with taking it.  She is to use heat, rest, stretch for additional symptom relief.  Discussed the utility of physical therapy for treatment as well as prevention of additional episodes and encouraged to follow-up with sports medicine clinic to consider referral to PT and additional interventions.  Discussed alarm symptoms that would warrant emergent evaluation including severe pain, difficulty ambulating, numbness, paresthesias, bowel/bladder incontinence or retention.  Strict return precautions given to which she expressed understanding.   Final Clinical Impressions(s) / UC Diagnoses   Final diagnoses:  Acute right-sided low back pain with right-sided sciatica  Fall, initial encounter     Discharge Instructions      We gave you an injection of Toradol which is like ibuprofen.  Please do not take NSAIDs including aspirin, ibuprofen/Tylenol, naproxen/Aleve for 12 to 24 hours.  You can use Tylenol for pain relief.  After the 24 hours you can alternate Tylenol and ibuprofen.  Use Zanaflex up to 3 times a day.  This make you sleepy do not drive or drink alcohol while taking it.  Use heat, rest, gentle stretch for symptom relief.  Follow-up with sports medicine to consider referral to physical therapy as we discussed.  If you have any worsening symptoms including increased pain, difficulty walking, going to the bathroom on yourself without noticing it you need to go to the ER.     ED Prescriptions     Medication Sig Dispense Auth. Provider   tiZANidine (ZANAFLEX) 4 MG capsule Take 1 capsule (4 mg total) by mouth 3 (three) times daily as needed for muscle spasms. 21 capsule Ra Pfiester, Noberto Retort, PA-C      PDMP not reviewed  this encounter.   Jeani Hawking, PA-C 04/18/21 [id number]

## 2021-04-18 NOTE — ED Triage Notes (Signed)
Pt is ambulatory to triage. Reports right anterior "hot burning" thigh pain since Friday. Pt was seen at [facility] today (dx with sciatica) and was given a shot, felt somewhat better. Took muscle relaxer around 2000 and woke up around 2230 with increased pain in her thigh and new numbness from her knee to her ankle in her right leg.

## 2021-04-19 LAB — D-DIMER, QUANTITATIVE: D-Dimer, Quant: 0.37 ug/mL-FEU (ref 0.00–0.50)

## 2021-04-19 MED ORDER — IBUPROFEN 600 MG PO TABS
600.0000 mg | ORAL_TABLET | Freq: Four times a day (QID) | ORAL | 0 refills | Status: DC | PRN
Start: 2021-04-19 — End: 2021-04-26

## 2021-04-19 MED ORDER — KETOROLAC TROMETHAMINE 30 MG/ML IJ SOLN
30.0000 mg | Freq: Once | INTRAMUSCULAR | Status: AC
  Administered 2021-04-19: 30 mg via INTRAMUSCULAR
  Filled 2021-04-19: qty 1

## 2021-04-19 NOTE — ED Provider Notes (Signed)
[facility] EMERGENCY DEPT Provider Note   CSN: [id number] Arrival date & time: 04/18/21  2316     History Chief Complaint  Patient presents with   Leg Pain    Anna Pugh is a 50 y.o. female.  HPI     This is a 50 year old female with a history of seizures and sciatica who presents with right leg numbness and pain.  Patient reports that she developed back pain and right thigh pain on Thursday.  She was seen and evaluated at urgent care earlier on Sunday and was given a shot of Toradol.  She states she had some relief of pain.  However, she went home and took a muscle relaxer and woke up with "20 out of 10 pain in the right leg."  She also reports some numbness from her knee to her ankle.  She is not had any bowel or bladder difficulty.  She reports a remote injury and history of sciatica in the past with similar symptoms.  She states that her pain right now is a 6 out of 10.  She has not taken any other pain medication after receiving Toradol in the urgent care.  Denies any history of cancer or IV drug use.  Denies any lower extremity swelling or history of blood clots.    Past Medical History:  Diagnosis Date   GERD (gastroesophageal reflux disease)    Localization-related (focal) (partial) epilepsy and epileptic syndromes with complex partial seizures, without mention of intractable epilepsy 01/04/2013   Memory deficit    Migraine without aura, without mention of intractable migraine without mention of status migrainosus 01/04/2013   Obesity    Seizures (HCC)     Patient Active Problem List   Diagnosis Date Noted   Fatigue 10/09/2018   Advice given about COVID-19 virus by telephone 10/09/2018   Partial epilepsy with impairment of consciousness (HCC) 01/04/2013   Migraine without aura 01/04/2013    Past Surgical History:  Procedure Laterality Date   TONSILLECTOMY       OB History     Gravida  0   Para  0   Term  0   Preterm  0   AB  0   Living   0      SAB  0   IAB  0   Ectopic  0   Multiple  0   Live Births  0           Family History  Problem Relation Age of Onset   Stroke Mother    Hypertension Mother    Migraines Father    Myasthenia gravis Father    Cancer Brother     Social History   Tobacco Use   Smoking status: Never   Smokeless tobacco: Never  Vaping Use   Vaping Use: Never used  Substance Use Topics   Alcohol use: No   Drug use: No    Home Medications Prior to Admission medications   Medication Sig Start Date End Date Taking? Authorizing Provider  ibuprofen (ADVIL) 600 MG tablet Take 1 tablet (600 mg total) by mouth every 6 (six) hours as needed. 04/19/21  Yes Raechal Raben, Mayer Masker, MD  carbamazepine ([name]) 200 MG 12 hr capsule Take 1 capsule (200mg ) in the am and 3 capsules (600mg ) in the evening 07/13/20   Lomax, Katharyn, NP  cetirizine (ZYRTEC) 10 MG tablet Take 10 mg by mouth daily.    [provider]  cholecalciferol (VITAMIN D) 1000 units tablet Take 1,000  Units by mouth daily.    [provider]  Pediatric Multiple Vit-C-FA (PEDIATRIC MULTIVITAMIN) chewable tablet Chew 1 tablet by mouth daily.    [provider]  rizatriptan (MAXALT) 10 MG tablet Take 1 tablet (10 mg total) by mouth 3 (three) times daily as needed for migraine. May repeat in 2 hours if needed 07/13/20   Lomax, Nadezhda, NP  tiZANidine (ZANAFLEX) 4 MG capsule Take 1 capsule (4 mg total) by mouth 3 (three) times daily as needed for muscle spasms. 04/18/21   Raspet, Erin K, PA-C  VIMPAT 50 MG TABS tablet Take 1 tablet (50 mg total) by mouth 2 (two) times daily. 11/03/20   York Spaniel, MD    Allergies    Keppra [levetiracetam], Lamotrigine, Topamax [topiramate], and Azithromycin  Review of Systems   Review of Systems  Constitutional:  Negative for fever.  Genitourinary:  Negative for difficulty urinating.  Musculoskeletal:        Leg pain  Neurological:  Positive for numbness. Negative for  weakness.  All other systems reviewed and are negative.  Physical Exam Updated Vital Signs BP 124/69   Pulse 62   Temp (!) 97.1 F (36.2 C)   Resp 18   Ht 1.549 m (5\' 1" )   Wt 98.9 kg   SpO2 98%   BMI 41.19 kg/m   Physical Exam Vitals and nursing note reviewed.  Constitutional:      Appearance: She is well-developed. She is obese. She is not ill-appearing.  HENT:     Head: Normocephalic and atraumatic.     Nose: Nose normal.     Mouth/Throat:     Mouth: Mucous membranes are moist.  Eyes:     Pupils: Pupils are equal, round, and reactive to light.  Cardiovascular:     Rate and Rhythm: Normal rate and regular rhythm.  Pulmonary:     Effort: Pulmonary effort is normal. No respiratory distress.  Abdominal:     General: Bowel sounds are normal.     Palpations: Abdomen is soft.  Musculoskeletal:     Cervical back: Neck supple.     Comments: Tenderness to palpation right thigh, no obvious swelling, no overlying skin changes, no bruising, negative straight leg raise  Skin:    General: Skin is warm and dry.  Neurological:     Mental Status: She is alert and oriented to person, place, and time.     Comments: 5 out of 5 strength bilateral lower extremities, equal patellar reflexes bilateral  Psychiatric:        Mood and Affect: Mood normal.    ED Results / Procedures / Treatments   Labs (all labs ordered are listed, but only abnormal results are displayed) Labs Reviewed  D-DIMER, QUANTITATIVE    EKG None  Radiology No results found.  Procedures Procedures   Medications Ordered in ED Medications  ketorolac (TORADOL) 30 MG/ML injection 30 mg (30 mg Intramuscular Given 04/19/21 0034)    ED Course  I have reviewed the triage vital signs and the nursing notes.  Pertinent labs & imaging results that were available during my care of the patient were reviewed by me and considered in my medical decision making (see chart for details).    MDM Rules/Calculators/A&P                            Patient presents with ongoing leg pain and now with numbness.  She is nontoxic and vital  signs are reassuring.  She has no signs or symptoms of cauda equina.  She reports that pain originally started in her back and was consistent with her prior sciatica.  Her exam is somewhat inconsistent.  She has a negative for leg raise and has some tenderness to palpation of the right thigh which is odd for sciatica traditionally.  Did send a D-dimer to screen for blood clots as she is low risk.  D-dimer is negative.  She has no overlying evidence of contusion.  No obvious injury.  She has been ambulatory.  She was instructed by urgent care not to take ibuprofen for 24 hours after Toradol.  Discussed with the patient that she can actually take anti-inflammatories within 6 hours of Toradol.  She was given a second dose of Toradol here in the emergency department.  Recommend she start scheduled anti-inflammatories at 8 AM.  She may take muscle relaxers as needed.  Suspect inflammatory process with sciatica as the most likely cause.  After history, exam, and medical workup I feel the patient has been appropriately medically screened and is safe for discharge home. Pertinent diagnoses were discussed with the patient. Patient was given return precautions.  Final Clinical Impression(s) / ED Diagnoses Final diagnoses:  Sciatica of right side    Rx / DC Orders ED Discharge Orders          Ordered    ibuprofen (ADVIL) 600 MG tablet  Every 6 hours PRN        04/19/21 0109             Shon Baton, MD 04/19/21 [phone]

## 2021-04-19 NOTE — ED Notes (Signed)
Pt verbalizes understanding of discharge instructions. Opportunity for questioning and answers were provided. Pt discharged from ED to home.   ? ?

## 2021-04-19 NOTE — Discharge Instructions (Addendum)
You were seen today for ongoing leg pain and back pain.  Your screening test for blood clots was negative.  To help with inflammation you should take ibuprofen every 6 hours.  You may take your next dose at 8 AM.  Muscle relaxers as needed; however do not drive while taking muscle relaxers.

## 2021-04-26 ENCOUNTER — Ambulatory Visit (INDEPENDENT_AMBULATORY_CARE_PROVIDER_SITE_OTHER): Payer: 59 | Admitting: Family Medicine

## 2021-04-26 VITALS — BP 115/57 | Ht 61.5 in | Wt 220.0 lb

## 2021-04-26 DIAGNOSIS — M79604 Pain in right leg: Secondary | ICD-10-CM | POA: Diagnosis not present

## 2021-04-26 MED ORDER — IBUPROFEN 600 MG PO TABS: 600.0000 mg | ORAL_TABLET | Freq: Three times a day (TID) | ORAL | 1 refills | Status: DC | PRN

## 2021-04-26 NOTE — Patient Instructions (Addendum)
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 Take ibuprofen 600mg  three times a day with food for pain and inflammation. Basic strengthening exercises of your right leg. Use recumbent bike at the gym. Follow up with me mid-late next week.  If you're doing great, I can see you after the holidays instead (we are closed the week between christmas and new years).

## 2021-04-27 ENCOUNTER — Encounter: Payer: Self-pay | Admitting: Family Medicine

## 2021-04-27 NOTE — Progress Notes (Signed)
PCP: Patient, No Pcp Per (Inactive)  Subjective:   HPI: Patient is a 50 y.o. female here for right leg pain.  Patient reports she had some soreness around thanksgiving in low back but this resolved. Thursday 12/1 she noticed her right quad muscle felt sore but this progressed and was extremely severe on Friday 12/2 . Went to urgent care Sunday 12/4 and given toradol, muscle relaxant. She also took ibuprofen. Had numbness from the knee down and noticed the leg in this area goes numb with activities. Level of pain that same day led her to go to emergency department She's taken tizanidine and ibuprofen 600mg  tabs - latter seems to have helped the most. No bowel/bladder dysfunction.  Past Medical History:  Diagnosis Date   GERD (gastroesophageal reflux disease)    Localization-related (focal) (partial) epilepsy and epileptic syndromes with complex partial seizures, without mention of intractable epilepsy 01/04/2013   Memory deficit    Migraine without aura, without mention of intractable migraine without mention of status migrainosus 01/04/2013   Obesity    Seizures (HCC)     Current Outpatient Medications on File Prior to Visit  Medication Sig Dispense Refill   carbamazepine ([name]) 200 MG 12 hr capsule Take 1 capsule (200mg ) in the am and 3 capsules (600mg ) in the evening 450 capsule 3   cetirizine (ZYRTEC) 10 MG tablet Take 10 mg by mouth daily.     cholecalciferol (VITAMIN D) 1000 units tablet Take 1,000 Units by mouth daily.     Pediatric Multiple Vit-C-FA (PEDIATRIC MULTIVITAMIN) chewable tablet Chew 1 tablet by mouth daily.     rizatriptan (MAXALT) 10 MG tablet Take 1 tablet (10 mg total) by mouth 3 (three) times daily as needed for migraine. May repeat in 2 hours if needed 12 tablet 3   tiZANidine (ZANAFLEX) 4 MG capsule Take 1 capsule (4 mg total) by mouth 3 (three) times daily as needed for muscle spasms. 21 capsule 0   VIMPAT 50 MG TABS tablet Take 1 tablet (50 mg total) by  mouth 2 (two) times daily. 180 tablet 1   No current facility-administered medications on file prior to visit.    Past Surgical History:  Procedure Laterality Date   TONSILLECTOMY      Allergies  Allergen Reactions   Keppra [Levetiracetam] Rash   Lamotrigine Rash   Topamax [Topiramate] Other (See Comments)    "made me feel so bad"   Azithromycin Other (See Comments)    Disoriented and out of breath     BP (!) 115/57    Ht 5' 1.5" (1.562 m)    Wt 220 lb (99.8 kg)    BMI 40.90 kg/m   [facility] Exercise 04/26/2021  Frequency of aerobic exercise (# of days/week) 3  Average time in minutes 15  Frequency of strengthening activities (# of days/week) 2    No flowsheet data found.      Objective:  Physical Exam:  Gen: NAD, comfortable in exam room  Back: No gross deformity, scoliosis. TTP mildly right lumbar paraspinal region.  No midline or bony TTP. FROM without pain. Strength LEs 4/5 all muscle groups right lower extremity, 5/5 left.   1+ MSRs in patellar and achilles tendons, equal bilaterally. Negative SLRs. Sensation intact to light touch bilaterally. Negative logroll right hip.   Assessment & Plan:  1. Right leg pain/numbness - noted mild weakness throughout entire right lower extremity which would be unusual with a radiculopathy.  More indicative of a lumbar plexopathy.  Drug interaction between prednisone and her carbamazepine and she is clinically improving so will continue ibuprofen 600mg  three times a day.  Strengthening exercises for right lower extremity.  Discussed exercises at gym as well.  F/u late next week for reevaluation.  Consider formal physical therapy.

## 2021-05-03 ENCOUNTER — Other Ambulatory Visit: Payer: Self-pay | Admitting: Family Medicine

## 2021-05-03 ENCOUNTER — Ambulatory Visit (INDEPENDENT_AMBULATORY_CARE_PROVIDER_SITE_OTHER): Payer: 59 | Admitting: Family Medicine

## 2021-05-03 VITALS — BP 134/68 | Ht 61.5 in | Wt 220.0 lb

## 2021-05-03 DIAGNOSIS — G40209 Localization-related (focal) (partial) symptomatic epilepsy and epileptic syndromes with complex partial seizures, not intractable, without status epilepticus: Secondary | ICD-10-CM

## 2021-05-03 DIAGNOSIS — M79604 Pain in right leg: Secondary | ICD-10-CM

## 2021-05-03 MED ORDER — LACOSAMIDE 50 MG PO TABS: 50.0000 mg | ORAL_TABLET | Freq: Two times a day (BID) | ORAL | 0 refills | Status: DC

## 2021-05-03 NOTE — Telephone Encounter (Signed)
Pt requesting refill for VIMPAT 50 MG TABS tablet. Pharmacy [id number] - [place], [place] - [address] AT [facility] OF [address] & [address]

## 2021-05-03 NOTE — Patient Instructions (Signed)
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 Continue ibuprofen 600mg  three times a day with food for pain and inflammation. Continue strengthening exercises of your right leg. Use recumbent bike at the gym - can add elliptical now too and anything in the water. Have a safe trip to ! Let me know in 2-3 weeks how you're doing (ok to message me through mychart) Consider physical therapy, nerve conduction studies if not improving as expected.

## 2021-05-04 ENCOUNTER — Encounter: Payer: Self-pay | Admitting: Family Medicine

## 2021-05-04 ENCOUNTER — Telehealth: Payer: Self-pay

## 2021-05-04 NOTE — Telephone Encounter (Signed)
I have submitted a PA request for Lacosamide 50mg  on CMM, Key: [id number] - PA Case ID: .  Awaiting determination from [facility].

## 2021-05-04 NOTE — Progress Notes (Signed)
PCP: Patient, No Pcp Per (Inactive)  Subjective:   HPI: Patient is a 50 y.o. female here for right leg pain.  12/12: Patient reports she had some soreness around thanksgiving in low back but this resolved. Thursday 12/1 she noticed her right quad muscle felt sore but this progressed and was extremely severe on Friday 12/2 . Went to urgent care Sunday 12/4 and given toradol, muscle relaxant. She also took ibuprofen. Had numbness from the knee down and noticed the leg in this area goes numb with activities. Level of pain that same day led her to go to emergency department She's taken tizanidine and ibuprofen 600mg  tabs - latter seems to have helped the most. No bowel/bladder dysfunction.  12/19: Patient reports some improvement since last visit. Having episodes of pain though that last about an hour - start dull but then become intense. Definitely not as bad as previously though. Taking ibuprofen 600mg  three times a day regularly. Still with numbness down right leg. No bowel/bladder dysfunction.  Past Medical History:  Diagnosis Date   GERD (gastroesophageal reflux disease)    Localization-related (focal) (partial) epilepsy and epileptic syndromes with complex partial seizures, without mention of intractable epilepsy 01/04/2013   Memory deficit    Migraine without aura, without mention of intractable migraine without mention of status migrainosus 01/04/2013   Obesity    Seizures (HCC)     Current Outpatient Medications on File Prior to Visit  Medication Sig Dispense Refill   carbamazepine ([name]) 200 MG 12 hr capsule Take 1 capsule (200mg ) in the am and 3 capsules (600mg ) in the evening 450 capsule 3   cetirizine (ZYRTEC) 10 MG tablet Take 10 mg by mouth daily.     cholecalciferol (VITAMIN D) 1000 units tablet Take 1,000 Units by mouth daily.     ibuprofen (ADVIL) 600 MG tablet Take 1 tablet (600 mg total) by mouth every 8 (eight) hours as needed. 90 tablet 1   Pediatric  Multiple Vit-C-FA (PEDIATRIC MULTIVITAMIN) chewable tablet Chew 1 tablet by mouth daily.     rizatriptan (MAXALT) 10 MG tablet Take 1 tablet (10 mg total) by mouth 3 (three) times daily as needed for migraine. May repeat in 2 hours if needed 12 tablet 3   tiZANidine (ZANAFLEX) 4 MG capsule Take 1 capsule (4 mg total) by mouth 3 (three) times daily as needed for muscle spasms. 21 capsule 0   No current facility-administered medications on file prior to visit.    Past Surgical History:  Procedure Laterality Date   TONSILLECTOMY      Allergies  Allergen Reactions   Keppra [Levetiracetam] Rash   Lamotrigine Rash   Topamax [Topiramate] Other (See Comments)    "made me feel so bad"   Azithromycin Other (See Comments)    Disoriented and out of breath     BP 134/68    Ht 5' 1.5" (1.562 m)    Wt 220 lb (99.8 kg)    BMI 40.90 kg/m   [facility] 04/26/2021  Frequency of aerobic exercise (# of days/week) 3  Average time in minutes 15  Frequency of strengthening activities (# of days/week) 2    No flowsheet data found.      Objective:  Physical Exam:  Gen: NAD, comfortable in exam room  Back: No gross deformity, scoliosis. TTP mildly right lumbar paraspinal region.  No midline or bony TTP. FROM. Strength LEs 5/5 all muscle groups except 4/5 right hip flexion and knee extension.   1+  MSRs in patellar and achilles tendons, equal bilaterally. Negative SLRs. Sensation intact to light touch bilaterally.  Assessment & Plan:  1. Right leg pain/numbness - patient improving slowly.  Of note strength in right lower extremity has improved compared to last visit as well.  Continue with ibuprofen.  Tizanidine if needed.  Continue strengthening exercises.  Consider physical therapy in future.  Let us know how she's doin in 2-3 weeks.

## 2021-05-05 NOTE — Telephone Encounter (Signed)
Request Reference Number: [id number]. LACOSAMIDE TAB 50MG  is approved through 05/04/2022.

## 2021-05-26 ENCOUNTER — Encounter: Payer: Self-pay | Admitting: Family Medicine

## 2021-08-04 ENCOUNTER — Other Ambulatory Visit: Payer: Self-pay

## 2021-08-04 ENCOUNTER — Encounter: Payer: Self-pay | Admitting: Family Medicine

## 2021-08-04 ENCOUNTER — Ambulatory Visit (INDEPENDENT_AMBULATORY_CARE_PROVIDER_SITE_OTHER): Payer: 59 | Admitting: Family Medicine

## 2021-08-04 VITALS — BP 126/73 | HR 61 | Ht 62.0 in | Wt 222.5 lb

## 2021-08-04 DIAGNOSIS — G40209 Localization-related (focal) (partial) symptomatic epilepsy and epileptic syndromes with complex partial seizures, not intractable, without status epilepticus: Secondary | ICD-10-CM | POA: Diagnosis not present

## 2021-08-04 MED ORDER — CARBAMAZEPINE ER 200 MG PO CP12: ORAL_CAPSULE | ORAL | 3 refills | Status: DC

## 2021-08-04 NOTE — Progress Notes (Signed)
? ? ?PATIENT: Anna Pugh ?DOB: 29-Nov-1970 ? ?REASON FOR VISIT: follow up ?HISTORY FROM: patient ? ?Chief Complaint  ?Patient presents with  ? Follow-up  ?  Rm 1, alone. Here for yearly Sz f/u. Pt reports no sz like activity since last ov. Concerns with memory. per pt "not forgetting but more so doing something and forgetting she has done it.".   ?  ? ?HISTORY OF PRESENT ILLNESS: ? ?08/04/21 ALL: ?Anna Pugh returns for follow up for seizures and migraines. She continues lacosamide 50mg  BID and carbamazepine 200mg  in am and 600mg  in pm. She was previously taking  Tegretol XR 200mg  in the morning but advised to discontinue at last visit 06/2020. Plan was to increase carbamazepine dose to 400mg  in the am and continue 600mg  in pm. She has taken 200/600 since 06/2020. She reports feeling well. She denies seizure activity. She is tolerating meds well. She can't remember the last time she had a migraines. She is not taking rizatriptan very often at all. She has noticed some difficulty with being forgetful, recently. She may do something then forget she did it. She has had more stressors over the past few months. Her mother and father were ill but now doing better. Her cat that was insulin dependant passed away recently. She has started going to the gym. She has worked up to about 20 minutes on the Elliptical 4 days a week.  ? ?07/13/20 ALL (M[place]):  ?Anna Pugh is a 51 y.o. female here today for follow up for seizures and migraines. She continues lacosamide 50mg  BID and carbamazepine 200mg  in am and 600mg  in pm. Carbamazepine dose previously 500mg  and 600mg  but changed during 2020. She is not sure when or why. She has been stable on current AED dose. No seizure activity in the past year. She has not had any migraines. Rizatriptan works well for abortive therapy.  ? ?She is in with her dad who is having some health problems. She is not working right now.  ? ?01/13/2020 ALL:  ?Anna Pugh is a 51 y.o. female here today for  follow up for seizures. Last event was on 08/08/2019. She is doing well. She denies recent seizure activity. She is tolerating Vimpat and carbamazepine. She was seen in the ER for a migraine on 11/03/2019. Labs were stable. She reports migraines are stable. She continues acute management with rizatriptan. She continues to work as a . She is inquiring about being able to drive.  ? ? : [phone] (fax) ? ? ?HISTORY: (copied from my note on 08/08/2019) ? ?Anna Pugh is a 51 y.o. female here today for follow up. She reports concerns of having a seizure today. She was driving this to work around 8am. She reports that she dropped her head and lost consciousness for "a few seconds." She does not think she swerved. She did not have an accident. No convulsive activity. No biting of tongue or incontinence. She reports numbness of lower extremities started after seizure. She was confused but was able to call a friend to pick her up. The friend presents with her today and stats that she seemed completely normal when when picked her up. She reports forgetting to take her medication this morning. She also forgot to take morning medications once last week. She does not usually have any difficulty taking her medications. This was typical for her with previous seizures. She denies headaches. She had a mild headache after seizure but states she has not had any migraines  in years. Last seizure was about two years ago.  ?  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 She reports that she has been very anxious about working. She has been worried about her patient getting Covid or having another seizure. She has not slept well recently. She has a cat that wakes her up at night. She is able to care for herself. No difficulty with driving.  ?  ?HISTORY: (copied from my note on 01/09/2019) ?  ?Anna Pugh is a 51 y.o. female here today for follow up. She has been  taking 1 capsule of [name] 200mg  and 3 tablets of Tegretol XR 100mg  for a total of 500mg  in the am and 3 capsules of [name] in the evenings.  She has not had any seizure activity or migraines in the past year. She is living alone and able to perform ADL's. She is working full time. She is able to drive without difficulty. She is feeling well today.  ?  ?HISTORY: (copied from [place]egan Mill[place]'s note on 01/22/2018) ?  ?Anna Pugh is a 51 year old female with a history of seizures and migraine headaches.  She returns today for follow-up.  She reports that she has been doing very well.  Not had any seizure events and no migraines in the last year.  She reports that her current dose of [name] is working well for her.  She is able to complete all ADLs independently.  She operates a Librarian, academicmotor vehicle without difficulty.  Denies any changes in her gait or balance.  She returns today for evaluation. ?  ?HISTORY Anna Pugh is a 51 year old left-handed white female with a history of seizures and migraine headaches. The patient has done quite well with both issues, she has not had any migraine headaches since last seen one year ago, she has not had any recurring seizures. She is on maximum doses of [name], she is tolerating the medication well. She denies drowsiness or gait instability. She is not on vitamin D supplementation. She returns for routine reevaluation. No other significant medical issues have come up since last seen. ? ? ?REVIEW OF SYSTEMS: Out of a complete 14 system review of symptoms, the patient complains only of the following symptoms, headaches, forgetfulness   ?and all other reviewed systems are negative. ? ?ALLERGIES: ?Allergies  ?Allergen Reactions  ? Keppra [Levetiracetam] Rash  ? Lamotrigine Rash  ? Topamax [Topiramate] Other (See Comments)  ?  "made me feel so bad"  ? Azithromycin Other (See Comments)  ?  Disoriented and out of breath ?  ? ? ?HOME MEDICATIONS: ?Outpatient Medications Prior to Visit   ?Medication Sig Dispense Refill  ? cetirizine (ZYRTEC) 10 MG tablet Take 10 mg by mouth daily.    ? cholecalciferol (VITAMIN D) 1000 units tablet Take 1,000 Units by mouth daily.    ? ibuprofen (ADVIL) 600 MG tablet Take 1 tablet (600 mg total) by mouth every 8 (eight) hours as needed. 90 tablet 1  ? lacosamide (VIMPAT) 50 MG TABS tablet Take 1 tablet (50 mg total) by mouth 2 (two) times daily. 180 tablet 0  ? Pediatric Multiple Vit-C-FA (PEDIATRIC MULTIVITAMIN) chewable tablet Chew 1 tablet by mouth daily.    ? rizatriptan (MAXALT) 10 MG tablet Take 1 tablet (10 mg total) by mouth 3 (three) times daily as needed for migraine. May repeat in 2 hours if needed 12 tablet 3  ? carbamazepine ([name]) 200 MG 12 hr capsule Take 1 capsule (200mg ) in  the am and 3 capsules (600mg ) in the evening 450 capsule 3  ? tiZANidine (ZANAFLEX) 4 MG capsule Take 1 capsule (4 mg total) by mouth 3 (three) times daily as needed for muscle spasms. 21 capsule 0  ? ?No facility-administered medications prior to visit.  ? ? ?PAST MEDICAL HISTORY: ?Past Medical History:  ?Diagnosis Date  ? GERD (gastroesophageal reflux disease)   ? Localization-related (focal) (partial) epilepsy and epileptic syndromes with complex partial seizures, without mention of intractable epilepsy 01/04/2013  ? Memory deficit   ? Migraine without aura, without mention of intractable migraine without mention of status migrainosus 01/04/2013  ? Obesity   ? Seizures ([facility])   ? ? ?PAST SURGICAL HISTORY: ?Past Surgical History:  ?Procedure Laterality Date  ? TONSILLECTOMY    ? ? ?FAMILY HISTORY: ?Family History  ?Problem Relation Age of Onset  ? Stroke Mother   ? Hypertension Mother   ? Migraines Father   ? Myasthenia gravis Father   ? Cancer Brother   ? ? ?SOCIAL HISTORY: ?Social History  ? ?Socioeconomic History  ? Marital status: Single  ?  Spouse name: Not on file  ? Number of children: 0  ? Years of education: Bachelor  ? Highest education level: Not on file   ?Occupational History  ?  Comment: Employed by 01/06/2013  ?Tobacco Use  ? Smoking status: Never  ? Smokeless tobacco: Never  ?Vaping Use  ? Vaping Use: Never used  ?Substance and Sexual Activity  ? Alcohol use:

## 2021-08-04 NOTE — Patient Instructions (Addendum)
Below is our plan: ? ?We will continue lacosamide 50mg  twice daily and carbamazepine 200mg  in am and 600mg  in pm.  ? ?Please make sure you are consistent with timing of seizure medication. I recommend annual visit with primary care provider (PCP) for complete physical and routine blood work. We will monitor vitamin D level. I recommend daily intake of vitamin D (400-800iu) and calcium (800-1000mg ) for bone health. Discuss Dexa screening with PCP.  ? ?According to [place] law, you can not drive unless you are seizure / syncope free for at least 6 months and under physician's care. ? ?Please maintain precautions. Do not participate in activities where a loss of awareness could harm you or someone else. No swimming alone, no tub bathing, no hot tubs, no driving, no operating motorized vehicles (cars, ATVs, motocycles, etc), lawnmowers, power tools or firearms. No standing at heights, such as rooftops, ladders or stairs. Avoid hot objects such as stoves, heaters, open fires. Wear a helmet when riding a bicycle, scooter, skateboard, etc. and avoid areas of traffic. Set your water heater to 120 degrees or less.  ? ? ?Please make sure you are staying well hydrated. I recommend 50-60 ounces daily. Well balanced diet and regular exercise encouraged. Consistent sleep schedule with 6-8 hours recommended.  ? ?Please continue follow up with care team as directed.  ? ?Follow up with me in 1 year  ? ?You may receive a survey regarding today's visit. I encourage you to leave honest feed back as I do use this information to improve patient care. Thank you for seeing me today!  ? ?Management of Memory Problems ?  ?There are some general things you can do to help manage your memory problems.  Your memory may not in fact recover, but by using techniques and strategies you will be able to manage your memory difficulties better. ?  ?1)  Establish a routine. ?Try to establish and then stick to a regular routine.  By doing this, you will get  used to what to expect and you will reduce the need to rely on your memory.  Also, try to do things at the same time of day, such as taking your medication or checking your calendar first thing in the morning. ?Think about think that you can do as a part of a regular routine and make a list.  Then enter them into a daily planner to remind you.  This will help you establish a routine. ?  ?2)  Organize your environment. ?Organize your environment so that it is uncluttered.  Decrease visual stimulation.  Place everyday items such as keys or cell phone in the same place every day (ie.  Basket next to front door) ?Use post it notes with a brief message to yourself (ie. Turn off light, lock the door) ?Use labels to indicate where things go (ie. Which cupboards are for food, dishes, etc.) ?Keep a notepad and pen by the telephone to take messages ?  ?3)  Memory Aids ?A diary or journal/notebook/daily planner ?Making a list (shopping list, chore list, to do list that needs to be done) ?Using an alarm as a reminder (kitchen timer or cell phone alarm) ?Using cell phone to store information (Notes, Calendar, Reminders) ?Calendar/White board placed in a prominent position ?Post-it notes ?  ?In order for memory aids to be useful, you need to have good habits.  It's no good remembering to make a note in your journal if you don't remember to look in it.  Try setting aside a certain time of day to look in journal. ?  ?4)  Improving mood and managing fatigue. ?There may be other factors that contribute to memory difficulties.  Factors, such as anxiety, depression and tiredness can affect memory. ?Regular gentle exercise can help improve your mood and give you more energy. ?Simple relaxation techniques may help relieve symptoms of anxiety ?Try to get back to completing activities or hobbies you enjoyed doing in the past. ?Learn to pace yourself through activities to decrease fatigue. ?Find out about some local support groups where you  can share experiences with others. ?Try and achieve 7-8 hours of sleep at night. ? ?

## 2021-08-05 ENCOUNTER — Other Ambulatory Visit: Payer: Self-pay | Admitting: Family Medicine

## 2021-08-05 DIAGNOSIS — G40209 Localization-related (focal) (partial) symptomatic epilepsy and epileptic syndromes with complex partial seizures, not intractable, without status epilepticus: Secondary | ICD-10-CM

## 2021-08-05 LAB — CBC WITH DIFFERENTIAL/PLATELET
Basophils Absolute: 0.1 10*3/uL (ref 0.0–0.2)
Basos: 1 %
EOS (ABSOLUTE): 0.2 10*3/uL (ref 0.0–0.4)
Eos: 2 %
Hematocrit: 38.4 % (ref 34.0–46.6)
Hemoglobin: 12.6 g/dL (ref 11.1–15.9)
Immature Grans (Abs): 0 10*3/uL (ref 0.0–0.1)
Immature Granulocytes: 0 %
Lymphocytes Absolute: 1.7 10*3/uL (ref 0.7–3.1)
Lymphs: 25 %
MCH: 27.9 pg (ref 26.6–33.0)
MCHC: 32.8 g/dL (ref 31.5–35.7)
MCV: 85 fL (ref 79–97)
Monocytes Absolute: 0.9 10*3/uL (ref 0.1–0.9)
Monocytes: 13 %
Neutrophils Absolute: 4.2 10*3/uL (ref 1.4–7.0)
Neutrophils: 59 %
Platelets: 307 10*3/uL (ref 150–450)
RBC: 4.51 x10E6/uL (ref 3.77–5.28)
RDW: 13.3 % (ref 11.7–15.4)
WBC: 7 10*3/uL (ref 3.4–10.8)

## 2021-08-05 LAB — COMPREHENSIVE METABOLIC PANEL
ALT: 18 IU/L (ref 0–32)
AST: 16 IU/L (ref 0–40)
Albumin/Globulin Ratio: 1.8 (ref 1.2–2.2)
Albumin: 4.4 g/dL (ref 3.8–4.9)
Alkaline Phosphatase: 108 IU/L (ref 44–121)
BUN/Creatinine Ratio: 16 (ref 9–23)
BUN: 12 mg/dL (ref 6–24)
Bilirubin Total: <0.2 mg/dL (ref 0.0–1.2)
CO2: 24 mmol/L (ref 20–29)
Calcium: 8.8 mg/dL (ref 8.7–10.2)
Chloride: 103 mmol/L (ref 96–106)
Creatinine, Ser: 0.77 mg/dL (ref 0.57–1.00)
Globulin, Total: 2.5 g/dL (ref 1.5–4.5)
Glucose: 91 mg/dL (ref 70–99)
Potassium: 4 mmol/L (ref 3.5–5.2)
Sodium: 140 mmol/L (ref 134–144)
Total Protein: 6.9 g/dL (ref 6.0–8.5)
eGFR: 93 mL/min/{1.73_m2} (ref >59)

## 2021-08-05 LAB — CARBAMAZEPINE LEVEL, TOTAL: Carbamazepine (Tegretol), S: 6.8 ug/mL (ref 4.0–12.0)

## 2021-08-05 LAB — VITAMIN D 25 HYDROXY (VIT D DEFICIENCY, FRACTURES): Vit D, 25-Hydroxy: 19.3 ng/mL — ABNORMAL LOW (ref 30.0–100.0)

## 2021-08-05 MED ORDER — LACOSAMIDE 50 MG PO TABS: 50.0000 mg | ORAL_TABLET | Freq: Two times a day (BID) | ORAL | 1 refills | Status: DC

## 2022-01-10 ENCOUNTER — Other Ambulatory Visit: Payer: Self-pay | Admitting: Neurology

## 2022-01-10 ENCOUNTER — Encounter: Payer: Self-pay | Admitting: Family Medicine

## 2022-01-10 MED ORDER — RIZATRIPTAN BENZOATE 10 MG PO TABS: 10.0000 mg | ORAL_TABLET | Freq: Three times a day (TID) | ORAL | 3 refills | Status: DC | PRN

## 2022-02-09 ENCOUNTER — Other Ambulatory Visit: Payer: Self-pay

## 2022-02-09 DIAGNOSIS — G40209 Localization-related (focal) (partial) symptomatic epilepsy and epileptic syndromes with complex partial seizures, not intractable, without status epilepticus: Secondary | ICD-10-CM

## 2022-02-09 MED ORDER — LACOSAMIDE 50 MG PO TABS: 50.0000 mg | ORAL_TABLET | Freq: Two times a day (BID) | ORAL | 1 refills | Status: DC

## 2022-02-09 NOTE — Telephone Encounter (Signed)
Pt has an up coming appt and has been checked in the registry. 

## 2022-04-25 ENCOUNTER — Encounter: Payer: Self-pay | Admitting: Family Medicine

## 2022-05-11 ENCOUNTER — Other Ambulatory Visit: Payer: Self-pay

## 2022-05-11 MED ORDER — RIZATRIPTAN BENZOATE 10 MG PO TABS: 10.0000 mg | ORAL_TABLET | Freq: Three times a day (TID) | ORAL | 3 refills | Status: DC | PRN

## 2022-05-30 ENCOUNTER — Ambulatory Visit (HOSPITAL_COMMUNITY)
Admission: RE | Admit: 2022-05-30 | Discharge: 2022-05-30 | Disposition: A | Discharge status: Home / Self Care | Payer: 59 | Source: Ambulatory Visit | Attending: Family Medicine | Admitting: Family Medicine

## 2022-05-30 ENCOUNTER — Encounter (HOSPITAL_COMMUNITY): Payer: Self-pay

## 2022-05-30 VITALS — BP 108/62 | HR 90 | Temp 99.1°F | Resp 16

## 2022-05-30 DIAGNOSIS — J069 Acute upper respiratory infection, unspecified: Secondary | ICD-10-CM

## 2022-05-30 LAB — POC INFLUENZA A AND B ANTIGEN (URGENT CARE ONLY)
INFLUENZA A ANTIGEN, POC: NEGATIVE
INFLUENZA B ANTIGEN, POC: NEGATIVE

## 2022-05-30 MED ORDER — PROMETHAZINE-DM 6.25-15 MG/5ML PO SYRP: 5.0000 mL | ORAL_SOLUTION | Freq: Four times a day (QID) | ORAL | 0 refills | Status: DC | PRN

## 2022-05-30 NOTE — ED Provider Notes (Signed)
[facility]   [id number] 05/30/22 Arrival Time: 1007  ASSESSMENT & PLAN:  1. Viral URI with cough    Discussed typical duration of likely viral illness. Results for orders placed or performed during the hospital encounter of 05/30/22  POC Influenza A & B Ag (Urgent Care)  Result Value Ref Range   INFLUENZA A ANTIGEN, POC NEGATIVE NEGATIVE   INFLUENZA B ANTIGEN, POC NEGATIVE NEGATIVE   OTC symptom care as needed.  New Prescriptions   PROMETHAZINE-DEXTROMETHORPHAN (PROMETHAZINE-DM) 6.25-15 MG/5ML SYRUP    Take 5 mLs by mouth 4 (four) times daily as needed for cough.     Follow-up Information     [facility].   Specialty: Urgent Care Why: If worsening or failing to improve as anticipated. Contact information: [place] [id number] [phone]               Work note provided.  Reviewed expectations re: course of current medical issues. Questions answered. Outlined signs and symptoms indicating need for more acute intervention. Understanding verbalized. After Visit Summary given.   SUBJECTIVE: History from: Patient. Anna Pugh is a 52 y.o. female. Reports: nasal cong, sinus pressure, mild cough; abrupt onset; approx 48 hours ago. Denies: fever and difficulty breathing. Normal PO intake without n/v/d.  OBJECTIVE:  Vitals:   05/30/22 1036  BP: 108/62  Pulse: 90  Resp: 16  Temp: 99.1 F (37.3 C)  TempSrc: Oral  SpO2: 96%    General appearance: alert; no distress Eyes: PERRLA; EOMI; conjunctiva normal HENT: [place]; AT; with nasal congestion Neck: supple  Lungs: speaks full sentences without difficulty; unlabored; ctab Extremities: no edema Skin: warm and dry Neurologic: normal gait Psychological: alert and cooperative; normal mood and affect  Labs: Results for orders placed or performed during the hospital encounter of 05/30/22  POC Influenza A & B Ag (Urgent Care)  Result Value Ref  Range   INFLUENZA A ANTIGEN, POC NEGATIVE NEGATIVE   INFLUENZA B ANTIGEN, POC NEGATIVE NEGATIVE   Labs Reviewed  POC INFLUENZA A AND B ANTIGEN (URGENT CARE ONLY)    Imaging: No results found.  Allergies  Allergen Reactions   Keppra [Levetiracetam] Rash   Lamotrigine Rash   Topamax [Topiramate] Other (See Comments)    "made me feel so bad"   Azithromycin Other (See Comments)    Disoriented and out of breath     Past Medical History:  Diagnosis Date   GERD (gastroesophageal reflux disease)    Localization-related (focal) (partial) epilepsy and epileptic syndromes with complex partial seizures, without mention of intractable epilepsy 01/04/2013   Memory deficit    Migraine without aura, without mention of intractable migraine without mention of status migrainosus 01/04/2013   Obesity    Seizures ([facility])    Social History   Socioeconomic History   Marital status: Single    Spouse name: Not on file   Number of children: 0   Years of education: Bachelor   Highest education level: Not on file  Occupational History    Comment: Employed by [employer]  Tobacco Use   Smoking status: Never   Smokeless tobacco: Never  Vaping Use   Vaping Use: Never used  Substance and Sexual Activity   Alcohol use: No   Drug use: No   Sexual activity: Not Currently  Other Topics Concern   Not on file  Social History Narrative   Patient is single with no children.   Patient is left handed.  Patient has a Bachelor's degree.   Patient drinks 3 cups daily.   Social Determinants of Health   Financial Resource Strain: Not on file  Food Insecurity: Not on file  Transportation Needs: Not on file  Physical Activity: Not on file  Stress: Not on file  Social Connections: Not on file  Intimate Partner Violence: Not on file   Family History  Problem Relation Age of Onset   Stroke Mother    Hypertension Mother    Migraines Father    Myasthenia gravis Father    Cancer Brother    Past  Surgical History:  Procedure Laterality Date   TONSILLECTOMY       Vanessa Kick, MD 05/30/22 1140

## 2022-05-30 NOTE — Discharge Instructions (Signed)
Be aware, your cough medication may cause drowsiness. Please do not drive, operate heavy machinery or make important decisions while on this medication, it can cloud your judgement.  Follow up with your primary care doctor or here if you are not seeing improvement of your symptoms over the next several days, sooner if you feel you are worsening.  Caring for yourself: Get plenty of rest. Drink plenty of fluids, enough so that your urine is light yellow or clear like water. If you have kidney, heart, or liver disease and have to limit fluids, talk with your doctor before you increase the amount of fluids you drink. Take an over-the-counter pain medicine if needed, such as acetaminophen (Tylenol), ibuprofen (Advil, Motrin), or naproxen (Aleve), to relieve fever, headache, and muscle aches. Read and follow all instructions on the label. No one younger than 20 should take aspirin. It has been linked to Reye syndrome, a serious illness. Before you use over the counter cough and cold medicines, check the label. These medicines may not be safe for children younger than age 6 or for people with certain health problems. If the skin around your nose and lips becomes sore, put some petroleum jelly on the area.  Avoid spreading a flu-like virus: Wash your hands regularly, and keep your hands away from your face.  Stay home from school, work, and other public places until you are feeling better and your fever has been gone for at least 24 hours. The fever needs to have gone away on its own without the help of medicine.

## 2022-05-30 NOTE — ED Triage Notes (Signed)
Symptoms started this past Saturday. Reports nasal congestion, body aches, chills, cough, facial pressure and pain. Denies sore throat, N/V/D, abdominal pain, chest pain, SOB, wheezing. Neighbors have been sick with flu recently. Treating with mucinex DM and "sinus max" with minimal improvement

## 2022-06-08 ENCOUNTER — Ambulatory Visit: Payer: Self-pay

## 2022-06-08 ENCOUNTER — Ambulatory Visit (HOSPITAL_COMMUNITY)
Admission: EM | Admit: 2022-06-08 | Discharge: 2022-06-08 | Disposition: A | Discharge status: Home / Self Care | Payer: 59 | Attending: Family Medicine | Admitting: Family Medicine

## 2022-06-08 ENCOUNTER — Encounter (HOSPITAL_COMMUNITY): Payer: Self-pay | Admitting: *Deleted

## 2022-06-08 DIAGNOSIS — J069 Acute upper respiratory infection, unspecified: Secondary | ICD-10-CM | POA: Diagnosis not present

## 2022-06-08 NOTE — Discharge Instructions (Signed)
You can take Tylenol or ibuprofen as needed for your aches and sore throat.  Make sure you are getting plenty of fluids and

## 2022-06-08 NOTE — ED Triage Notes (Signed)
Pt states she came into urgent care for viral illness on 05/30/2022. She took an at home COVID test on 05/31/2022 which was positive. She was feeling better.   Today she woke up with weakness, muscle aches, sinus pressure. She was again directly exposed to [place] on 06/06/2022. Pt states she went to [facility] today for FLU/COVID test they were both negitive there. She states she is here today to see if she needs meds for her sx.   She does have the cough meds from last OV at [facility] which she takes as needed.

## 2022-06-08 NOTE — ED Provider Notes (Signed)
[facility]    CSN: [id number] Arrival date & time: 06/08/22  1731      History   Chief Complaint Chief Complaint  Patient presents with   Covid Positive   Facial Pain    HPI Anna Pugh is a 52 y.o. female.   HPI Here for sore throat and postnasal congestion and myalgia.  Symptoms began today.  She has not had any fever with this episode and she is not really coughing much.  No nausea or vomiting or diarrhea.  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  She was last exposed to the student yesterday  On January 13 this patient had begun having URI symptoms and she was seen here at this urgent care on January 15.  Flu point-of-care test was negative.  She was told that she was 4 days into the illness and therefore no COVID swab was done.  She did a home COVID swab on January 16 and it was positive, but her symptoms were improving already.  She comes in today to make sure she does not need to do any treatments.   Past Medical History:  Diagnosis Date   GERD (gastroesophageal reflux disease)    Localization-related (focal) (partial) epilepsy and epileptic syndromes with complex partial seizures, without mention of intractable epilepsy 01/04/2013   Memory deficit    Migraine without aura, without mention of intractable migraine without mention of status migrainosus 01/04/2013   Obesity    Seizures (HCC)     Patient Active Problem List   Diagnosis Date Noted   Fatigue 10/09/2018   Advice given about COVID-19 virus by telephone 10/09/2018   Partial epilepsy with impairment of consciousness (HCC) 01/04/2013   Migraine without aura 01/04/2013    Past Surgical History:  Procedure Laterality Date   TONSILLECTOMY      OB History     Gravida  0   Para  0   Term  0   Preterm  0   AB  0   Living  0      SAB  0   IAB  0   Ectopic  0   Multiple  0   Live Births  0            Home Medications    Prior  to Admission medications   Medication Sig Start Date End Date Taking? Authorizing Provider  carbamazepine ([name]) 200 MG 12 hr capsule Take 1 capsule (200mg ) in the am and 3 capsules (600mg ) in the evening 08/04/21  Yes Lomax, Jermia, NP  cetirizine (ZYRTEC) 10 MG tablet Take 10 mg by mouth daily.   Yes [provider]  cholecalciferol (VITAMIN D) 1000 units tablet Take 1,000 Units by mouth daily.   Yes [provider]  lacosamide (VIMPAT) 50 MG TABS tablet Take 1 tablet (50 mg total) by mouth 2 (two) times daily. 02/09/22  Yes Lomax, Donice, NP  Pediatric Multiple Vit-C-FA (PEDIATRIC MULTIVITAMIN) chewable tablet Chew 1 tablet by mouth daily.   Yes [provider]  promethazine-dextromethorphan (PROMETHAZINE-DM) 6.25-15 MG/5ML syrup Take 5 mLs by mouth 4 (four) times daily as needed for cough. 05/30/22  Yes 07-18-1990, MD  rizatriptan (MAXALT) 10 MG tablet Take 1 tablet (10 mg total) by mouth 3 (three) times daily as needed for migraine. May repeat in 2 hours if needed 05/11/22  Yes Dohmeier, Mardella Layman, MD    Family History Family History  Problem Relation Age of Onset  Stroke Mother    Hypertension Mother    Migraines Father    Myasthenia gravis Father    Cancer Brother     Social History Social History   Tobacco Use   Smoking status: Never   Smokeless tobacco: Never  Vaping Use   Vaping Use: Never used  Substance Use Topics   Alcohol use: No   Drug use: No     Allergies   Keppra [levetiracetam], Lamotrigine, Topamax [topiramate], and Azithromycin   Review of Systems Review of Systems   Physical Exam Triage Vital Signs ED Triage Vitals  Enc Vitals Group     BP 06/08/22 1834 139/60     Pulse Rate 06/08/22 1834 78     Resp 06/08/22 1834 20     Temp 06/08/22 1834 98.9 F (37.2 C)     Temp Source 06/08/22 1834 Oral     SpO2 06/08/22 1834 98 %     Weight --      Height --      Head Circumference --      Peak Flow --      Pain Score  06/08/22 1833 0     Pain Loc --      Pain Edu? --      Excl. in [place]? --    No data found.  Updated Vital Signs BP 139/60 (BP Location: Right Arm)   Pulse 78   Temp 98.9 F (37.2 C) (Oral)   Resp 20   SpO2 98%   Visual Acuity Right Eye Distance:   Left Eye Distance:   Bilateral Distance:    Right Eye Near:   Left Eye Near:    Bilateral Near:     Physical Exam Vitals reviewed.  Constitutional:      General: She is not in acute distress.    Appearance: She is not toxic-appearing.  HENT:     Right Ear: Tympanic membrane and ear canal normal.     Left Ear: Tympanic membrane and ear canal normal.     Nose: Nose normal.     Mouth/Throat:     Mouth: Mucous membranes are moist.     Pharynx: No oropharyngeal exudate or posterior oropharyngeal erythema.  Eyes:     Extraocular Movements: Extraocular movements intact.     Conjunctiva/sclera: Conjunctivae normal.     Pupils: Pupils are equal, round, and reactive to light.  Cardiovascular:     Rate and Rhythm: Normal rate and regular rhythm.     Heart sounds: No murmur heard. Pulmonary:     Effort: Pulmonary effort is normal. No respiratory distress.     Breath sounds: No stridor. No wheezing, rhonchi or rales.  Musculoskeletal:     Cervical back: Neck supple.  Lymphadenopathy:     Cervical: No cervical adenopathy.  Skin:    Capillary Refill: Capillary refill takes less than 2 seconds.     Coloration: Skin is not jaundiced or pale.  Neurological:     General: No focal deficit present.     Mental Status: She is alert and oriented to person, place, and time.  Psychiatric:        Behavior: Behavior normal.      UC Treatments / Results  Labs (all labs ordered are listed, but only abnormal results are displayed) Labs Reviewed - No data to display  EKG   Radiology No results found.  Procedures Procedures (including critical care time)  Medications Ordered in UC Medications - No data to display  Initial  Impression / Assessment and Plan / UC Course  I have reviewed the triage vital signs and the nursing notes.  Pertinent labs & imaging results that were available during my care of the patient were reviewed by me and considered in my medical decision making (see chart for details).     We discussed the length of time from when she initially had her symptoms and it tested positive for COVID.  I offered COVID PCR, though I did discuss with her that even if it were positive at this time, I do not think that would mean that the COVID was causing her symptoms that she began with today.  I did discuss with her for future reference that if she ever does have COVID symptoms treatment with antivirals within 5 days of starting symptoms is indicated to prevent her from becoming more severe.  She does have an elevated BMI. Final Clinical Impressions(s) / UC Diagnoses   Final diagnoses:  Viral upper respiratory tract infection     Discharge Instructions      You can take Tylenol or ibuprofen as needed for your aches and sore throat.  Make sure you are getting plenty of fluids and     ED Prescriptions   None    PDMP not reviewed this encounter.   Barrett Henle, MD 06/08/22 [phone]

## 2022-08-08 ENCOUNTER — Ambulatory Visit: Payer: 59 | Admitting: Family Medicine

## 2022-08-08 ENCOUNTER — Encounter: Payer: Self-pay | Admitting: Family Medicine

## 2022-08-08 VITALS — BP 124/74 | HR 69 | Ht 62.0 in | Wt 220.8 lb

## 2022-08-08 DIAGNOSIS — G43009 Migraine without aura, not intractable, without status migrainosus: Secondary | ICD-10-CM

## 2022-08-08 DIAGNOSIS — G40209 Localization-related (focal) (partial) symptomatic epilepsy and epileptic syndromes with complex partial seizures, not intractable, without status epilepticus: Secondary | ICD-10-CM

## 2022-08-08 MED ORDER — CARBAMAZEPINE ER 200 MG PO CP12: ORAL_CAPSULE | ORAL | 3 refills | Status: DC

## 2022-08-08 MED ORDER — RIZATRIPTAN BENZOATE 10 MG PO TABS: 10.0000 mg | ORAL_TABLET | Freq: Three times a day (TID) | ORAL | 11 refills | Status: DC | PRN

## 2022-08-08 MED ORDER — LACOSAMIDE 50 MG PO TABS: 50.0000 mg | ORAL_TABLET | Freq: Two times a day (BID) | ORAL | 1 refills | Status: DC

## 2022-08-08 NOTE — Patient Instructions (Signed)
Below is our plan:  We will continue lacosamide 50mg  twice daily, carbamazepine 200mg  in am and 600mg  in pm.   Please make sure you are consistent with timing of seizure medication. I recommend annual visit with primary care provider (PCP) for complete physical and routine blood work. I recommend daily intake of vitamin D (400-800iu) and calcium (800-1000mg ) for bone health. Discuss Dexa screening with PCP.   According to [place] law, you can not drive unless you are seizure / syncope free for at least 6 months and under physician's care.  Please maintain precautions. Do not participate in activities where a loss of awareness could harm you or someone else. No swimming alone, no tub bathing, no hot tubs, no driving, no operating motorized vehicles (cars, ATVs, motocycles, etc), lawnmowers, power tools or firearms. No standing at heights, such as rooftops, ladders or stairs. Avoid hot objects such as stoves, heaters, open fires. Wear a helmet when riding a bicycle, scooter, skateboard, etc. and avoid areas of traffic. Set your water heater to 120 degrees or less.  Please make sure you are staying well hydrated. I recommend 50-60 ounces daily. Well balanced diet and regular exercise encouraged. Consistent sleep schedule with 6-8 hours recommended.   Please continue follow up with care team as directed.   Follow up with me in 1 year   You may receive a survey regarding today's visit. I encourage you to leave honest feed back as I do use this information to improve patient care. Thank you for seeing me today!

## 2022-08-08 NOTE — Progress Notes (Signed)
PATIENT: Anna Pugh DOB: May 06, 1971  REASON FOR VISIT: follow up HISTORY FROM: patient  Chief Complaint  Patient presents with   Follow-up    RM 1, alone. Last seen 08/04/21. Having increased migraines.  Has not had any seizures. Going through menopause and feels this may be r/t to increase in migraines. Has had at least 10 migraines since Christmas. Having to use a lot more maxalt than typical. Last migraine today, took maxalt. Helped but still lightheaded. Had friend drive her to appt. Has not had time to lay down today to sleep off migraine like typical.      HISTORY OF PRESENT ILLNESS:  08/08/22 ALL: Anna Pugh returns for follow up for seizures and migraines. She continues lacosamide 50mg  BID and carb 200/600mg . She is tolerating meds well. No seizure activity. She has had a few more migraines over the past 6 months. Usually 12 tabs of rizatriptan would last a year. She is averaging about 3-4 migraines per month. She correlates this to going through menopause. Rizatriptan works well for abortive therapy. She tolerates it well. Rarely has lightheadedness. She is drinking 64 ounces of water daily. She is walking every day. She has participated in activity challenges each month. Most recently completed the epilepsy challenge of walking 40 miles. She has hit 90 miles for the month. She is feeling great.   08/04/2021 ALL: Anna Pugh returns for follow up for seizures and migraines. She continues lacosamide 50mg  BID and carbamazepine 200mg  in am and 600mg  in pm. She was previously taking  Tegretol XR 200mg  in the morning but advised to discontinue at last visit 06/2020. Plan was to increase carbamazepine dose to 400mg  in the am and continue 600mg  in pm. She has taken 200/600 since 06/2020. She reports feeling well. She denies seizure activity. She is tolerating meds well. She can't remember the last time she had a migraines. She is not taking rizatriptan very often at all. She has noticed some difficulty with  being forgetful, recently. She may do something then forget she did it. She has had more stressors over the past few months. Her mother and father were ill but now doing better. Her cat that was insulin dependant passed away recently. She has started going to the gym. She has worked up to about 20 minutes on the Elliptical 4 days a week.   07/13/20 ALL ([place]):  Anna Pugh is a 52 y.o. female here today for follow up for seizures and migraines. She continues lacosamide 50mg  BID and carbamazepine 200mg  in am and 600mg  in pm. Carbamazepine dose previously 500mg  and 600mg  but changed during 2020. She is not sure when or why. She has been stable on current AED dose. No seizure activity in the past year. She has not had any migraines. Rizatriptan works well for abortive therapy.   She is in [place] with her dad who is having some health problems. She is not working right now.   01/13/2020 ALL:  Anna Pugh is a 52 y.o. female here today for follow up for seizures. Last event was on 08/08/2019. She is doing well. She denies recent seizure activity. She is tolerating Vimpat and carbamazepine. She was seen in the ER for a migraine on 11/03/2019. Labs were stable. She reports migraines are stable. She continues acute management with rizatriptan. 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 She is inquiring about being able to drive.   Human Resources manager: Lona Millard [phone] (fax)   HISTORY: (copied from my note on 08/08/2019)  Anna Pugh is a 52 y.o. female here today for follow up. She reports concerns of having a seizure today. She was driving this to work around [place]. She reports that she dropped her head and lost consciousness for "a few seconds." She does not think she swerved. She did not have an accident. No convulsive activity. No biting of tongue or incontinence. She reports numbness of lower extremities started after seizure. She was confused but was able to call a friend to pick her up. The friend  presents with her today and stats that she seemed completely normal when when picked her up. She reports forgetting to take her medication this morning. She also forgot to take morning medications once last week. She does not usually have any difficulty taking her medications. This was typical for her with previous seizures. She denies headaches. She had a mild headache after seizure but states she has not had any migraines in years. Last seizure was about two years ago.    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 She reports that she has been very anxious about working. She has been worried about her patient getting Covid or having another seizure. She has not slept well recently. She has a cat that wakes her up at night. She is able to care for herself. No difficulty with driving.    HISTORY: (copied from my note on 01/09/2019)   Anna Pugh is a 52 y.o. female here today for follow up. She has been taking 1 capsule of [name] 200mg  and 3 tablets of Tegretol XR 100mg  for a total of 500mg  in the am and 3 capsules of [name] in the evenings.  She has not had any seizure activity or migraines in the past year. She is living alone and able to perform ADL's. She is working full time. She is able to drive without difficulty. She is feeling well today.    HISTORY: (copied from [place] note on 01/22/2018)   Anna Pugh is a 52 year old female with a history of seizures and migraine headaches.  She returns today for follow-up.  She reports that she has been doing very well.  Not had any seizure events and no migraines in the last year.  She reports that her current dose of [name] is working well for her.  She is able to complete all ADLs independently.  She operates a Teacher, music without difficulty.  Denies any changes in her gait or balance.  She returns today for evaluation.   HISTORY Anna Pugh is a 52 year old left-handed white female with a history of  seizures and migraine headaches. The patient has done quite well with both issues, she has not had any migraine headaches since last seen one year ago, she has not had any recurring seizures. She is on maximum doses of [name], she is tolerating the medication well. She denies drowsiness or gait instability. She is not on vitamin D supplementation. She returns for routine reevaluation. No other significant medical issues have come up since last seen.   REVIEW OF SYSTEMS: Out of a complete 14 system review of symptoms, the patient complains only of the following symptoms, headaches, forgetfulness   and all other reviewed systems are negative.  ALLERGIES: Allergies  Allergen Reactions   Keppra [Levetiracetam] Rash   Lamotrigine Rash   Topamax [Topiramate] Other (See Comments)    "made me feel so bad"   Azithromycin Other (See Comments)    Disoriented and out of  breath     HOME MEDICATIONS: Outpatient Medications Prior to Visit  Medication Sig Dispense Refill   b complex vitamins capsule Take 1 capsule by mouth daily.     cetirizine (ZYRTEC) 10 MG tablet Take 10 mg by mouth daily.     cholecalciferol (VITAMIN D) 1000 units tablet Take 1,000 Units by mouth daily.     Multiple Vitamin (MULTIVITAMIN PO) Take 1 tablet by mouth daily.     Pediatric Multiple Vit-C-FA (PEDIATRIC MULTIVITAMIN) chewable tablet Chew 1 tablet by mouth daily.     promethazine-dextromethorphan (PROMETHAZINE-DM) 6.25-15 MG/5ML syrup Take 5 mLs by mouth 4 (four) times daily as needed for cough. 118 mL 0   UNABLE TO FIND Med Name: chlorophyll water     carbamazepine ([name]) 200 MG 12 hr capsule Take 1 capsule (200mg ) in the am and 3 capsules (600mg ) in the evening 450 capsule 3   lacosamide (VIMPAT) 50 MG TABS tablet Take 1 tablet (50 mg total) by mouth 2 (two) times daily. 180 tablet 1   rizatriptan (MAXALT) 10 MG tablet Take 1 tablet (10 mg total) by mouth 3 (three) times daily as needed for migraine. May repeat  in 2 hours if needed 12 tablet 3   No facility-administered medications prior to visit.    PAST MEDICAL HISTORY: Past Medical History:  Diagnosis Date   GERD (gastroesophageal reflux disease)    Localization-related (focal) (partial) epilepsy and epileptic syndromes with complex partial seizures, without mention of intractable epilepsy 01/04/2013   Memory deficit    Migraine without aura, without mention of intractable migraine without mention of status migrainosus 01/04/2013   Obesity    Seizures ([place])     PAST SURGICAL HISTORY: Past Surgical History:  Procedure Laterality Date   TONSILLECTOMY      FAMILY HISTORY: Family History  Problem Relation Age of Onset   Stroke Mother    Hypertension Mother    Migraines Father    Myasthenia gravis Father    Cancer Brother     SOCIAL HISTORY: Social History   Socioeconomic History   Marital status: Single    Spouse name: Not on file   Number of children: 0   Years of education: Bachelor   Highest education level: Not on file  Occupational History    Comment: Employed by [employer]  Tobacco Use   Smoking status: Never   Smokeless tobacco: Never  Vaping Use   Vaping Use: Never used  Substance and Sexual Activity   Alcohol use: No   Drug use: No   Sexual activity: Not Currently  Other Topics Concern   Not on file  Social History Narrative   Patient is single with no children.   Patient is left handed.   Patient has a Bachelor's degree.   Patient drinks 3 cups daily.   Social Determinants of Health   Financial Resource Strain: Not on file  Food Insecurity: Not on file  Transportation Needs: Not on file  Physical Activity: Not on file  Stress: Not on file  Social Connections: Not on file  Intimate Partner Violence: Not on file      PHYSICAL EXAM  Vitals:   08/08/22 1354  BP: 124/74  Pulse: 69  Weight: 220 lb 12.8 oz (100.2 kg)  Height: 5\' 2"  (1.575 m)     Body mass index is 40.38  kg/m.  Generalized: Well developed, in no acute distress  Cardiology: normal rate and rhythm, no murmur noted Respiratory: clear to auscultation bilaterally  Neurological  examination  Mentation: Alert oriented to time, place, history taking. Follows all commands speech and language fluent Cranial nerve II-XII: Pupils were equal round reactive to light. Extraocular movements were full, visual field were full Motor: The motor testing reveals 5 over 5 strength of all 4 extremities. Good symmetric motor tone is noted throughout.  Gait and station: Gait is normal.     DIAGNOSTIC DATA (LABS, IMAGING, TESTING) - I reviewed patient records, labs, notes, testing and imaging myself where available.      No data to display           Lab Results  Component Value Date   WBC 7.0 08/04/2021   HGB 12.6 08/04/2021   HCT 38.4 08/04/2021   MCV 85 08/04/2021   PLT 307 08/04/2021      Component Value Date/Time   NA 140 08/04/2021 1431   K 4.0 08/04/2021 1431   CL 103 08/04/2021 1431   CO2 24 08/04/2021 1431   GLUCOSE 91 08/04/2021 1431   GLUCOSE 105 (H) 11/03/2019 1547   BUN 12 08/04/2021 1431   CREATININE 0.77 08/04/2021 1431   CALCIUM 8.8 08/04/2021 1431   PROT 6.9 08/04/2021 1431   ALBUMIN 4.4 08/04/2021 1431   AST 16 08/04/2021 1431   ALT 18 08/04/2021 1431   ALKPHOS 108 08/04/2021 1431   BILITOT <0.2 08/04/2021 1431   GFRNONAA >60 11/03/2019 1547   GFRAA >60 11/03/2019 1547   Lab Results  Component Value Date   CHOL 218 (H) 01/14/2019   HDL 63 01/14/2019   LDLCALC 131 (H) 01/14/2019   TRIG 134 01/14/2019   CHOLHDL 3.5 01/14/2019   No results found for: "HGBA1C" Lab Results  Component Value Date   VITAMINB12 447 01/04/2013   Lab Results  Component Value Date   TSH 2.790 01/04/2013     ASSESSMENT AND PLAN 52 y.o. year old female  has a past medical history of GERD (gastroesophageal reflux disease), Localization-related (focal) (partial) epilepsy and epileptic  syndromes with complex partial seizures, without mention of intractable epilepsy (01/04/2013), Memory deficit, Migraine without aura, without mention of intractable migraine without mention of status migrainosus (01/04/2013), Obesity, and Seizures ([place]). here with     ICD-10-CM   1. Partial epilepsy with impairment of consciousness (HCC)  G40.209 CBC with Differential/Platelets    CMP    Carbamazepine level, total    lacosamide (VIMPAT) 50 MG TABS tablet    2. Migraine without aura and without status migrainosus, not intractable  G43.009       Ritta is doing well, today. She denies recent seizure activity. She is tolerating lacosamide and carbamazepine. We will continue carbamazepine 200 mg in the morning and 600 mg in the evening as well as lacosamide 50mg  BID. She will continue rizatriptan as needed for abortive therapy. She will journal migraines and let me know if frequency or intensity worsens. We can add preventative meds if needed. We will update labs, today. I have encouraged her to find a PCP for annual CPE and screenings. She was encouraged to stay well hydrated. Well balanced diet and regular exercise encouraged. She will follow up with me in 1 year, sooner if needed.    Orders Placed This Encounter  Procedures   CBC with Differential/Platelets   CMP   Carbamazepine level, total     Meds ordered this encounter  Medications   rizatriptan (MAXALT) 10 MG tablet    Sig: Take 1 tablet (10 mg total) by mouth 3 (three) times daily as  needed for migraine. May repeat in 2 hours if needed    Dispense:  12 tablet    Refill:  11    Order Specific Question:   Supervising Provider    Answer:   Melvenia Beam [id number]   carbamazepine ([name]) 200 MG 12 hr capsule    Sig: Take 1 capsule (200mg ) in the am and 3 capsules (600mg ) in the evening    Dispense:  450 capsule    Refill:  3    Order Specific Question:   Supervising Provider    Answer:   Melvenia Beam [id number]   lacosamide  (VIMPAT) 50 MG TABS tablet    Sig: Take 1 tablet (50 mg total) by mouth 2 (two) times daily.    Dispense:  180 tablet    Refill:  1    Order Specific Question:   Supervising Provider    Answer:   SAMAH, DIDION, FNP-C 08/08/2022, 2:30 PM [facility] [address] [place], [place] [address] [phone]

## 2022-08-09 LAB — CBC WITH DIFFERENTIAL/PLATELET
Basophils Absolute: 0.1 10*3/uL (ref 0.0–0.2)
Basos: 1 %
EOS (ABSOLUTE): 0.1 10*3/uL (ref 0.0–0.4)
Eos: 2 %
Hematocrit: 35.5 % (ref 34.0–46.6)
Hemoglobin: 11.6 g/dL (ref 11.1–15.9)
Immature Grans (Abs): 0 10*3/uL (ref 0.0–0.1)
Immature Granulocytes: 0 %
Lymphocytes Absolute: 1.7 10*3/uL (ref 0.7–3.1)
Lymphs: 27 %
MCH: 26 pg — ABNORMAL LOW (ref 26.6–33.0)
MCHC: 32.7 g/dL (ref 31.5–35.7)
MCV: 80 fL (ref 79–97)
Monocytes Absolute: 0.8 10*3/uL (ref 0.1–0.9)
Monocytes: 13 %
Neutrophils Absolute: 3.7 10*3/uL (ref 1.4–7.0)
Neutrophils: 57 %
Platelets: 385 10*3/uL (ref 150–450)
RBC: 4.46 x10E6/uL (ref 3.77–5.28)
RDW: 15.3 % (ref 11.7–15.4)
WBC: 6.4 10*3/uL (ref 3.4–10.8)

## 2022-08-09 LAB — COMPREHENSIVE METABOLIC PANEL
ALT: 21 IU/L (ref 0–32)
AST: 21 IU/L (ref 0–40)
Albumin/Globulin Ratio: 1.3 (ref 1.2–2.2)
Albumin: 4.2 g/dL (ref 3.8–4.9)
Alkaline Phosphatase: 113 IU/L (ref 44–121)
BUN/Creatinine Ratio: 15 (ref 9–23)
BUN: 13 mg/dL (ref 6–24)
Bilirubin Total: <0.2 mg/dL (ref 0.0–1.2)
CO2: 25 mmol/L (ref 20–29)
Calcium: 9.2 mg/dL (ref 8.7–10.2)
Chloride: 105 mmol/L (ref 96–106)
Creatinine, Ser: 0.87 mg/dL (ref 0.57–1.00)
Globulin, Total: 3.2 g/dL (ref 1.5–4.5)
Glucose: 96 mg/dL (ref 70–99)
Potassium: 4.3 mmol/L (ref 3.5–5.2)
Sodium: 143 mmol/L (ref 134–144)
Total Protein: 7.4 g/dL (ref 6.0–8.5)
eGFR: 80 mL/min/{1.73_m2} (ref >59)

## 2022-08-09 LAB — CARBAMAZEPINE LEVEL, TOTAL: Carbamazepine (Tegretol), S: 7 ug/mL (ref 4.0–12.0)

## 2022-09-13 ENCOUNTER — Other Ambulatory Visit: Payer: Self-pay | Admitting: *Deleted

## 2022-09-13 NOTE — Telephone Encounter (Signed)
Pt last seen on 08/08/22 per note " We will continue lacosamide 50mg  twice daily, carbamazepine 200mg  in am and 600mg  in pm. "  Follow up scheduled on 08/08/23 Last filled on 08/21/22 #360 tablets (90 day supply)

## 2022-10-03 ENCOUNTER — Encounter: Payer: Self-pay | Admitting: Family Medicine

## 2022-11-07 DIAGNOSIS — Z0289 Encounter for other administrative examinations: Secondary | ICD-10-CM

## 2022-11-08 ENCOUNTER — Telehealth: Payer: Self-pay | Admitting: *Deleted

## 2022-11-08 NOTE — Telephone Encounter (Signed)
Have completed/signed FMLA back to medical records to process for pt.

## 2022-11-15 ENCOUNTER — Other Ambulatory Visit (HOSPITAL_COMMUNITY): Payer: Self-pay

## 2022-11-15 ENCOUNTER — Telehealth: Payer: Self-pay

## 2022-11-15 NOTE — Telephone Encounter (Signed)
Pharmacy Patient Advocate Encounter   Received notification from [facility] that prior authorization for Lacosamide 50MG  tablets is required/requested.   PA submitted to [employer] via CoverMyMeds Key or [facility]) confirmation # [id number] Status is pending

## 2022-12-08 ENCOUNTER — Other Ambulatory Visit (HOSPITAL_COMMUNITY): Payer: Self-pay

## 2023-01-25 ENCOUNTER — Other Ambulatory Visit: Payer: Self-pay

## 2023-01-25 ENCOUNTER — Ambulatory Visit
Admission: EM | Admit: 2023-01-25 | Discharge: 2023-01-25 | Disposition: A | Discharge status: Home / Self Care | Payer: 59 | Attending: Internal Medicine | Admitting: Internal Medicine

## 2023-01-25 DIAGNOSIS — R21 Rash and other nonspecific skin eruption: Secondary | ICD-10-CM | POA: Insufficient documentation

## 2023-01-25 DIAGNOSIS — R110 Nausea: Secondary | ICD-10-CM | POA: Diagnosis present

## 2023-01-25 DIAGNOSIS — Z1152 Encounter for screening for COVID-19: Secondary | ICD-10-CM | POA: Diagnosis not present

## 2023-01-25 MED ORDER — TRIAMCINOLONE ACETONIDE 0.1 % EX CREA: 1.0000 | TOPICAL_CREAM | Freq: Two times a day (BID) | CUTANEOUS | 0 refills | Status: AC

## 2023-01-25 NOTE — ED Triage Notes (Signed)
Pt is here with a rash under her belly that started 4 days ago, states it itch's bad & has taken OTC cream to relieve discomfort. Pt states that today she has felt fatigue, nausea.

## 2023-01-25 NOTE — ED Provider Notes (Signed)
[facility]    CSN: [id number] Arrival date & time: 01/25/23  1556      History   Chief Complaint Chief Complaint  Patient presents with   Rash    HPI Anna Pugh is a 52 y.o. female presents for evaluation of rash.  Patient reports 4 days of a pruritic red rash on the underside of her abdomen.  No swelling, drainage, warmth, fevers or chills.  No new contacts including soaps, medications, lotions, etc.  She is using over-the-counter eczema cream that seem to have helped.  In addition she had COVID type symptoms 2 weeks ago that have almost resolved but continues to have some nausea.  Reports 3 negative home COVID test.  She would like to have a COVID PCR to confirm.  No other concerns at this time.   Rash Associated symptoms: nausea     Past Medical History:  Diagnosis Date   GERD (gastroesophageal reflux disease)    Localization-related (focal) (partial) epilepsy and epileptic syndromes with complex partial seizures, without mention of intractable epilepsy 01/04/2013   Memory deficit    Migraine without aura, without mention of intractable migraine without mention of status migrainosus 01/04/2013   Obesity    Seizures (HCC)     Patient Active Problem List   Diagnosis Date Noted   Fatigue 10/09/2018   Advice given about COVID-19 virus by telephone 10/09/2018   Partial epilepsy with impairment of consciousness (HCC) 01/04/2013   Migraine without aura 01/04/2013    Past Surgical History:  Procedure Laterality Date   TONSILLECTOMY      OB History     Gravida  0   Para  0   Term  0   Preterm  0   AB  0   Living  0      SAB  0   IAB  0   Ectopic  0   Multiple  0   Live Births  0            Home Medications    Prior to Admission medications   Medication Sig Start Date End Date Taking? Authorizing Provider  triamcinolone cream (KENALOG) 0.1 % Apply 1 Application topically 2 (two) times daily. 01/25/23  Yes Radford Pax, NP  b  complex vitamins capsule Take 1 capsule by mouth daily.    [provider]  carbamazepine ([name]) 200 MG 12 hr capsule Take 1 capsule (200mg ) in the am and 3 capsules (600mg ) in the evening 08/08/22   Lomax, Jayda, NP  cetirizine (ZYRTEC) 10 MG tablet Take 10 mg by mouth daily.    [provider]  cholecalciferol (VITAMIN D) 1000 units tablet Take 1,000 Units by mouth daily.    [provider]  lacosamide (VIMPAT) 50 MG TABS tablet Take 1 tablet (50 mg total) by mouth 2 (two) times daily. 08/08/22   Lomax, Ilda, NP  Multiple Vitamin (MULTIVITAMIN PO) Take 1 tablet by mouth daily.    [provider]  Pediatric Multiple Vit-C-FA (PEDIATRIC MULTIVITAMIN) chewable tablet Chew 1 tablet by mouth daily.    [provider]  promethazine-dextromethorphan (PROMETHAZINE-DM) 6.25-15 MG/5ML syrup Take 5 mLs by mouth 4 (four) times daily as needed for cough. 05/30/22   Mardella Layman, MD  rizatriptan (MAXALT) 10 MG tablet Take 1 tablet (10 mg total) by mouth 3 (three) times daily as needed for migraine. May repeat in 2 hours if needed 08/08/22   Shawnie Dapper, NP  UNABLE TO FIND Med Name: chlorophyll  water    [provider]    Family History Family History  Problem Relation Age of Onset   Stroke Mother    Hypertension Mother    Migraines Father    Myasthenia gravis Father    Cancer Brother     Social History Social History   Tobacco Use   Smoking status: Never   Smokeless tobacco: Never  Vaping Use   Vaping status: Never Used  Substance Use Topics   Alcohol use: No   Drug use: No     Allergies   Keppra [levetiracetam], Lamotrigine, Topamax [topiramate], and Azithromycin   Review of Systems Review of Systems  Gastrointestinal:  Positive for nausea.  Skin:  Positive for rash.     Physical Exam Triage Vital Signs ED Triage Vitals  Encounter Vitals Group     BP 01/25/23 1745 125/62     Systolic BP Percentile --      Diastolic BP  Percentile --      Pulse Rate 01/25/23 1745 64     Resp 01/25/23 1745 19     Temp 01/25/23 1745 98.2 F (36.8 C)     Temp Source 01/25/23 1745 Oral     SpO2 01/25/23 1745 100 %     Weight --      Height --      Head Circumference --      Peak Flow --      Pain Score 01/25/23 1744 0     Pain Loc --      Pain Education --      Exclude from Growth Chart --    No data found.  Updated Vital Signs BP 125/62 (BP Location: Right Arm)   Pulse 64   Temp 98.2 F (36.8 C) (Oral)   Resp 19   SpO2 100%   Visual Acuity Right Eye Distance:   Left Eye Distance:   Bilateral Distance:    Right Eye Near:   Left Eye Near:    Bilateral Near:     Physical Exam Vitals and nursing note reviewed.  Constitutional:      General: She is not in acute distress.    Appearance: Normal appearance. She is obese. She is not ill-appearing.  HENT:     Head: Normocephalic and atraumatic.  Eyes:     Pupils: Pupils are equal, round, and reactive to light.  Cardiovascular:     Rate and Rhythm: Normal rate.  Pulmonary:     Effort: Pulmonary effort is normal.  Skin:    General: Skin is warm and dry.     Findings: Rash is not crusting, nodular, purpuric, pustular, scaling, urticarial or vesicular.          Comments: There is a erythematous macular papular rash on the underside of patient's pannus.  No swelling, drainage.  Neurological:     General: No focal deficit present.     Mental Status: She is alert and oriented to person, place, and time.  Psychiatric:        Mood and Affect: Mood normal.        Behavior: Behavior normal.      UC Treatments / Results  Labs (all labs ordered are listed, but only abnormal results are displayed) Labs Reviewed  SARS CORONAVIRUS 2 (TAT 6-24 HRS)    EKG   Radiology No results found.  Procedures Procedures (including critical care time)  Medications Ordered in UC Medications - No data to display  Initial Impression / Assessment and  Plan / UC  Course  I have reviewed the triage vital signs and the nursing notes.  Pertinent labs & imaging results that were available during my care of the patient were reviewed by me and considered in my medical decision making (see chart for details).     Reviewed exam and symptoms with patient.  No red flags.  Will start Kenalog steroid cream for rash.  Can use over-the-counter Benadryl if needed.  COVID PCR and will contact if positive.  PCP follow-up 2 days for recheck.  ER precautions reviewed and patient verbalized understanding. Final Clinical Impressions(s) / UC Diagnoses   Final diagnoses:  Rash and nonspecific skin eruption  Nausea     Discharge Instructions      The clinic will contact you with results of the COVID test done today if positive.  Start Kenalog topical steroid cream to your rash twice daily.  Please follow-up with your PCP if your symptoms do not improve.  Please go to the ER if you develop any worsening symptoms.  Hope you feel better soon!   ED Prescriptions     Medication Sig Dispense Auth. Provider   triamcinolone cream (KENALOG) 0.1 % Apply 1 Application topically 2 (two) times daily. 45 g Radford Pax, NP      PDMP not reviewed this encounter.   Radford Pax, NP 01/25/23 [phone]

## 2023-01-25 NOTE — Discharge Instructions (Signed)
The clinic will contact you with results of the COVID test done today if positive.  Start Kenalog topical steroid cream to your rash twice daily.  Please follow-up with your PCP if your symptoms do not improve.  Please go to the ER if you develop any worsening symptoms.  Hope you feel better soon!

## 2023-01-26 LAB — SARS CORONAVIRUS 2 (TAT 6-24 HRS): SARS Coronavirus 2: NEGATIVE

## 2023-02-03 ENCOUNTER — Encounter (HOSPITAL_COMMUNITY): Payer: Self-pay

## 2023-02-03 ENCOUNTER — Ambulatory Visit (HOSPITAL_COMMUNITY)
Admission: RE | Admit: 2023-02-03 | Discharge: 2023-02-03 | Disposition: A | Discharge status: Home / Self Care | Payer: 59 | Source: Ambulatory Visit | Attending: Physician Assistant | Admitting: Physician Assistant

## 2023-02-03 VITALS — BP 122/73 | HR 63 | Temp 98.0°F | Resp 18

## 2023-02-03 DIAGNOSIS — R1010 Upper abdominal pain, unspecified: Secondary | ICD-10-CM | POA: Insufficient documentation

## 2023-02-03 DIAGNOSIS — R194 Change in bowel habit: Secondary | ICD-10-CM | POA: Insufficient documentation

## 2023-02-03 DIAGNOSIS — R109 Unspecified abdominal pain: Secondary | ICD-10-CM | POA: Insufficient documentation

## 2023-02-03 DIAGNOSIS — R110 Nausea: Secondary | ICD-10-CM | POA: Diagnosis present

## 2023-02-03 LAB — CBC WITH DIFFERENTIAL/PLATELET
Abs Immature Granulocytes: 0.02 10*3/uL (ref 0.00–0.07)
Basophils Absolute: 0.1 10*3/uL (ref 0.0–0.1)
Basophils Relative: 1 %
Eosinophils Absolute: 0.1 10*3/uL (ref 0.0–0.5)
Eosinophils Relative: 2 %
HCT: 38.4 % (ref 36.0–46.0)
Hemoglobin: 12.4 g/dL (ref 12.0–15.0)
Immature Granulocytes: 0 %
Lymphocytes Relative: 28 %
Lymphs Abs: 1.6 10*3/uL (ref 0.7–4.0)
MCH: 27.4 pg (ref 26.0–34.0)
MCHC: 32.3 g/dL (ref 30.0–36.0)
MCV: 85 fL (ref 80.0–100.0)
Monocytes Absolute: 0.6 10*3/uL (ref 0.1–1.0)
Monocytes Relative: 11 %
Neutro Abs: 3.3 10*3/uL (ref 1.7–7.7)
Neutrophils Relative %: 58 %
Platelets: 304 10*3/uL (ref 150–400)
RBC: 4.52 MIL/uL (ref 3.87–5.11)
RDW: 14.7 % (ref 11.5–15.5)
WBC: 5.8 10*3/uL (ref 4.0–10.5)
nRBC: 0 % (ref 0.0–0.2)

## 2023-02-03 LAB — COMPREHENSIVE METABOLIC PANEL
ALT: 18 U/L (ref 0–44)
AST: 23 U/L (ref 15–41)
Albumin: 3.6 g/dL (ref 3.5–5.0)
Alkaline Phosphatase: 90 U/L (ref 38–126)
Anion gap: 9 (ref 5–15)
BUN: 8 mg/dL (ref 6–20)
CO2: 24 mmol/L (ref 22–32)
Calcium: 8.5 mg/dL — ABNORMAL LOW (ref 8.9–10.3)
Chloride: 104 mmol/L (ref 98–111)
Creatinine, Ser: 0.77 mg/dL (ref 0.44–1.00)
GFR, Estimated: >60 mL/min (ref >60)
Glucose, Bld: 92 mg/dL (ref 70–99)
Potassium: 3.9 mmol/L (ref 3.5–5.1)
Sodium: 137 mmol/L (ref 135–145)
Total Bilirubin: 0.2 mg/dL — ABNORMAL LOW (ref 0.3–1.2)
Total Protein: 7.1 g/dL (ref 6.5–8.1)

## 2023-02-03 LAB — POCT URINALYSIS DIP (MANUAL ENTRY)
Bilirubin, UA: NEGATIVE
Blood, UA: NEGATIVE
Glucose, UA: NEGATIVE mg/dL
Ketones, POC UA: NEGATIVE mg/dL
Leukocytes, UA: NEGATIVE
Nitrite, UA: NEGATIVE
Protein Ur, POC: NEGATIVE mg/dL
Spec Grav, UA: 1.02 (ref 1.010–1.025)
Urobilinogen, UA: 0.2 E.U./dL
pH, UA: 6 (ref 5.0–8.0)

## 2023-02-03 LAB — POCT URINE PREGNANCY: Preg Test, Ur: NEGATIVE

## 2023-02-03 LAB — LIPASE, BLOOD: Lipase: 24 U/L (ref 11–51)

## 2023-02-03 MED ORDER — DICYCLOMINE HCL 20 MG PO TABS: 20.0000 mg | ORAL_TABLET | Freq: Two times a day (BID) | ORAL | 0 refills | Status: DC

## 2023-02-03 NOTE — ED Provider Notes (Signed)
[facility]    CSN: [id number] Arrival date & time: 02/03/23  0935      History   Chief Complaint Chief Complaint  Patient presents with   Abdominal Pain   Nausea   Diarrhea    HPI Anna Pugh is a 52 y.o. female.   Patient presents today with a 3-week history of intermittent abdominal symptoms.  She reports having intermittent abdominal pain that is rated 2 on a 0-10 pain scale, described as cramping/sharp, no aggravating or alleviating factors identified.  She has tried Pepto-Bismol without improvement of symptoms.  She will have nausea but no vomiting.  She also reports that her bowel movements have been looser and occasionally watery but denies any associated mucus or blood.  She generally has 1-2 bowel movements per day that are solid and she has had the same frequency of bowel movements but they have changed in terms of their consistency.  She denies any history of previous abdominal surgery and still has her gallbladder and appendix.  Denies any recent antibiotics, medication changes, GLP-1 use.  She did have some viral symptoms as several weeks ago but tested negative for COVID.  The symptoms have resolved.  She does have a history of GERD and was evaluated for GI symptoms and 2010.  She is no longer followed by GI specialist and does not take medication for this on a regular basis.    Past Medical History:  Diagnosis Date   GERD (gastroesophageal reflux disease)    Localization-related (focal) (partial) epilepsy and epileptic syndromes with complex partial seizures, without mention of intractable epilepsy 01/04/2013   Memory deficit    Migraine without aura, without mention of intractable migraine without mention of status migrainosus 01/04/2013   Obesity    Seizures (HCC)     Patient Active Problem List   Diagnosis Date Noted   Fatigue 10/09/2018   Advice given about COVID-19 virus by telephone 10/09/2018   Partial epilepsy with impairment of consciousness  (HCC) 01/04/2013   Migraine without aura 01/04/2013    Past Surgical History:  Procedure Laterality Date   TONSILLECTOMY      OB History     Gravida  0   Para  0   Term  0   Preterm  0   AB  0   Living  0      SAB  0   IAB  0   Ectopic  0   Multiple  0   Live Births  0            Home Medications    Prior to Admission medications   Medication Sig Start Date End Date Taking? Authorizing Provider  dicyclomine (BENTYL) 20 MG tablet Take 1 tablet (20 mg total) by mouth 2 (two) times daily. 02/03/23  Yes Chinyere Galiano K, PA-C  b complex vitamins capsule Take 1 capsule by mouth daily.    [provider]  carbamazepine ([name]) 200 MG 12 hr capsule Take 1 capsule (200mg ) in the am and 3 capsules (600mg ) in the evening 08/08/22   Lomax, Sahalie, NP  cetirizine (ZYRTEC) 10 MG tablet Take 10 mg by mouth daily.    [provider]  cholecalciferol (VITAMIN D) 1000 units tablet Take 1,000 Units by mouth daily.    [provider]  lacosamide (VIMPAT) 50 MG TABS tablet Take 1 tablet (50 mg total) by mouth 2 (two) times daily. 08/08/22   Lomax, Lanasia, NP  Multiple Vitamin (MULTIVITAMIN PO) Take 1  tablet by mouth daily.    [provider]  Pediatric Multiple Vit-C-FA (PEDIATRIC MULTIVITAMIN) chewable tablet Chew 1 tablet by mouth daily.    [provider]  rizatriptan (MAXALT) 10 MG tablet Take 1 tablet (10 mg total) by mouth 3 (three) times daily as needed for migraine. May repeat in 2 hours if needed 08/08/22   Lomax, Lari, NP  triamcinolone cream (KENALOG) 0.1 % Apply 1 Application topically 2 (two) times daily. 01/25/23   Radford Pax, NP  UNABLE TO FIND Med Name: chlorophyll water    [provider]    Family History Family History  Problem Relation Age of Onset   Stroke Mother    Hypertension Mother    Migraines Father    Myasthenia gravis Father    Cancer Brother     Social History Social History   Tobacco Use    Smoking status: Never   Smokeless tobacco: Never  Vaping Use   Vaping status: Never Used  Substance Use Topics   Alcohol use: No   Drug use: No     Allergies   Keppra [levetiracetam], Lamotrigine, Topamax [topiramate], and Azithromycin   Review of Systems Review of Systems  Constitutional:  Positive for activity change and appetite change. Negative for fatigue and fever.  Respiratory:  Negative for cough and shortness of breath.   Cardiovascular:  Negative for chest pain.  Gastrointestinal:  Positive for abdominal pain and nausea. Negative for diarrhea and vomiting.  Neurological:  Negative for dizziness, light-headedness and headaches.     Physical Exam Triage Vital Signs ED Triage Vitals  Encounter Vitals Group     BP 02/03/23 1018 122/73     Systolic BP Percentile --      Diastolic BP Percentile --      Pulse Rate 02/03/23 1018 63     Resp 02/03/23 1018 18     Temp 02/03/23 1018 98 F (36.7 C)     Temp Source 02/03/23 1018 Oral     SpO2 02/03/23 1018 97 %     Weight --      Height --      Head Circumference --      Peak Flow --      Pain Score 02/03/23 1015 2     Pain Loc --      Pain Education --      Exclude from Growth Chart --    No data found.  Updated Vital Signs BP 122/73 (BP Location: Right Arm)   Pulse 63   Temp 98 F (36.7 C) (Oral)   Resp 18   SpO2 97%   Visual Acuity Right Eye Distance:   Left Eye Distance:   Bilateral Distance:    Right Eye Near:   Left Eye Near:    Bilateral Near:     Physical Exam Vitals reviewed.  Constitutional:      General: She is awake. She is not in acute distress.    Appearance: Normal appearance. She is well-developed. She is not ill-appearing.     Comments: Very pleasant female appears stated age in no acute distress sitting comfortably in exam room  HENT:     Head: Normocephalic and atraumatic.     Mouth/Throat:     Mouth: Mucous membranes are moist.     Pharynx: Uvula midline. No oropharyngeal  exudate or posterior oropharyngeal erythema.  Cardiovascular:     Rate and Rhythm: Normal rate and regular rhythm.     Heart sounds: Normal  heart sounds, S1 normal and S2 normal. No murmur heard. Pulmonary:     Effort: Pulmonary effort is normal.     Breath sounds: Normal breath sounds. No wheezing, rhonchi or rales.     Comments: Clear to auscultation bilaterally Abdominal:     General: Bowel sounds are normal.     Palpations: Abdomen is soft.     Tenderness: There is no abdominal tenderness. There is no right CVA tenderness, left CVA tenderness, guarding or rebound. Negative signs include Murphy's sign, Rovsing's sign, McBurney's sign and psoas sign.  Psychiatric:        Behavior: Behavior is cooperative.      UC Treatments / Results  Labs (all labs ordered are listed, but only abnormal results are displayed) Labs Reviewed  COMPREHENSIVE METABOLIC PANEL  CBC WITH DIFFERENTIAL/PLATELET  LIPASE, BLOOD  POCT URINALYSIS DIP (MANUAL ENTRY)  POCT URINE PREGNANCY    EKG   Radiology No results found.  Procedures Procedures (including critical care time)  Medications Ordered in UC Medications - No data to display  Initial Impression / Assessment and Plan / UC Course  I have reviewed the triage vital signs and the nursing notes.  Pertinent labs & imaging results that were available during my care of the patient were reviewed by me and considered in my medical decision making (see chart for details).     Patient is well-appearing, afebrile, nontoxic, nontachycardic.  Vital signs and physical exam are reassuring with no indication for emergent evaluation or imaging.  UA was obtained that was negative.  Urine pregnancy was negative.  Discussed symptoms, related to IBS triggered by recent viral infection..  Will treat with dicyclomine twice daily to help with her pain relief.  She is eat a bland diet and drink plenty of fluid.  Basic labs were obtained including CBC, CMP, lipase  given pain is primarily in the epigastrium we will contact her if this is abnormal.  We discussed symptoms could also be related to H. pylori infection as she did report some early satiety.  Discussed that we cannot test for this in urgent care recommend that she follow-up with gastroenterology if her symptoms are improving quickly.  She was given contact information for local provider with instruction to call to schedule appointment.  She is to avoid NSAIDs and alcohol.  We discussed that if anything worsens and she has severe abdominal pain, hematochezia, hematemesis, nausea/vomiting interfering with oral intake, weakness, fever she needs to be seen immediately.  Strict return precautions given.  All questions answered patient satisfaction.  Work excuse note provided.  Final Clinical Impressions(s) / UC Diagnoses   Final diagnoses:  Abdominal cramping  Change in bowel habit  Nausea without vomiting  Upper abdominal pain     Discharge Instructions      Your urine was normal.  Eat a bland diet and avoid spicy/acidic/fatty foods.  Drink plenty of water.  Use dicyclomine to help with abdominal cramping.  If your symptoms are not improving quickly please follow-up with gastroenterology; call to schedule appointment.  We will contact you if any of your blood work is abnormal.  If anything worsens and you have abdominal pain, pelvic pain, fever, nausea, vomiting, blood in your vomit, blood in your stool you need to be seen immediately.     ED Prescriptions     Medication Sig Dispense Auth. Provider   dicyclomine (BENTYL) 20 MG tablet Take 1 tablet (20 mg total) by mouth 2 (two) times daily. 20 tablet Akaysha Cobern,  Noberto Retort, PA-C      PDMP not reviewed this encounter.   Jeani Hawking, PA-C 02/03/23 1150

## 2023-02-03 NOTE — Discharge Instructions (Addendum)
Your urine was normal.  Eat a bland diet and avoid spicy/acidic/fatty foods.  Drink plenty of water.  Use dicyclomine to help with abdominal cramping.  If your symptoms are not improving quickly please follow-up with gastroenterology; call to schedule appointment.  We will contact you if any of your blood work is abnormal.  If anything worsens and you have abdominal pain, pelvic pain, fever, nausea, vomiting, blood in your vomit, blood in your stool you need to be seen immediately.

## 2023-02-03 NOTE — ED Triage Notes (Signed)
Went to [facility] last Wednesday and was tested for COVID test and was negative. PT has had nausea, lose stool, stomach pain right side flank pain. Symptoms have been going on since Aug 29 Pt has taken pepto bizmal states it doesn't help much.

## 2023-02-21 ENCOUNTER — Other Ambulatory Visit: Payer: Self-pay

## 2023-02-21 DIAGNOSIS — G40209 Localization-related (focal) (partial) symptomatic epilepsy and epileptic syndromes with complex partial seizures, not intractable, without status epilepticus: Secondary | ICD-10-CM

## 2023-02-21 MED ORDER — LACOSAMIDE 50 MG PO TABS: 50.0000 mg | ORAL_TABLET | Freq: Two times a day (BID) | ORAL | 1 refills | Status: DC

## 2023-02-21 MED ORDER — LACOSAMIDE 50 MG PO TABS
50.0000 mg | ORAL_TABLET | Freq: Two times a day (BID) | ORAL | 1 refills | Status: DC
Start: 2023-02-21 — End: 2023-08-07

## 2023-02-21 NOTE — Telephone Encounter (Signed)
Pt last seen 08/08/2022 Upcoming Appointment 08/07/2023  Lacosamide 50mg  last filled 01/15/2023 Escript 02/21/2023

## 2023-06-20 NOTE — Progress Notes (Signed)
PUO

## 2023-08-02 NOTE — Patient Instructions (Addendum)
 Below is our plan:  We will continue carbamazepine 200mg  in am and 600mg  in pm. Continue lacosamide 50mg  twice daily. Continue rizatriptan as needed for migraine management. I will place a referral to primary care and psychology. Let me know if you do not hear back to schedule in the next two weeks.   Please make sure you are consistent with timing of seizure medication. I recommend annual visit with primary care provider (PCP) for complete physical and routine blood work. I recommend daily intake of vitamin D (400-800iu) and calcium (800-1000mg ) for bone health. Discuss Dexa screening with PCP.   According to [place] law, you can not drive unless you are seizure / syncope free for at least 6 months and under physician's care.  Please maintain precautions. Do not participate in activities where a loss of awareness could harm you or someone else. No swimming alone, no tub bathing, no hot tubs, no driving, no operating motorized vehicles (cars, ATVs, motocycles, etc), lawnmowers, power tools or firearms. No standing at heights, such as rooftops, ladders or stairs. Avoid hot objects such as stoves, heaters, open fires. Wear a helmet when riding a bicycle, scooter, skateboard, etc. and avoid areas of traffic. Set your water heater to 120 degrees or less.  SUDEP is the sudden, unexpected death of someone with epilepsy, who was otherwise healthy. In SUDEP cases, no other cause of death is found when an autopsy is done. Each year, more than 1 in 1,000 people with epilepsy die from SUDEP. This is the leading cause of death in people with uncontrolled seizures. Until further answers are available, the best way to prevent SUDEP is to lower your risk by controlling seizures. Research has found that people with all types of epilepsy that experience convulsive seizures can be at risk.  Please make sure you are staying well hydrated. I recommend 50-60 ounces daily. Well balanced diet and regular exercise encouraged.  Consistent sleep schedule with 6-8 hours recommended.   Please continue follow up with care team as directed.   Follow up with me in 1 year   You may receive a survey regarding today's visit. I encourage you to leave honest feed back as I do use this information to improve patient care. Thank you for seeing me today!

## 2023-08-02 NOTE — Progress Notes (Signed)
 PATIENT: Anna Pugh DOB: Sep 02, 1970  REASON FOR VISIT: follow up HISTORY FROM: patient  Chief Complaint  Patient presents with   Follow-up    Pt in room 2.alone. Here for seizure/migraine follow up. No recents seizures, reports migraines are better as well. Last migraine was in fall. Pt said Maxalt works well. Pt reports having issues with depression would like to discuss a referral.     HISTORY OF PRESENT ILLNESS:  08/07/23 ALL: Anna Pugh returns for follow up for seizures and migraines. She was last seen 07/2022 and doing well on lacosamide 50mg  BID and carb 200/600mg . Since, she reports doing well. She is tolerating meds without adverse effects. No seizures. She is driving without difficulty. Migraines are well managed. She may have a few per year. Rizatriptan works well. She has had more depression, recently. She feels less motivated to do things she enjoys. She loves to walk but has not been going to the gym as regularly. She likes walking with a virtual exercise program. She is currently walking the [place] trail. She denies SI. She does not have a PCP, currently.   08/08/2022 ALL:  Anna Pugh returns for follow up for seizures and migraines. She continues lacosamide 50mg  BID and carb 200/600mg . She is tolerating meds well. No seizure activity. She has had a few more migraines over the past 6 months. Usually 12 tabs of rizatriptan would last a year. She is averaging about 3-4 migraines per month. She correlates this to going through menopause. Rizatriptan works well for abortive therapy. She tolerates it well. Rarely has lightheadedness. She is drinking 64 ounces of water daily. She is walking every day. She has participated in activity challenges each month. Most recently completed the epilepsy challenge of walking 40 miles. She has hit 90 miles for the month. She is feeling great.   08/04/2021 ALL: Anna Pugh returns for follow up for seizures and migraines. She continues lacosamide 50mg  BID and  carbamazepine 200mg  in am and 600mg  in pm. She was previously taking  Tegretol XR 200mg  in the morning but advised to discontinue at last visit 06/2020. Plan was to increase carbamazepine dose to 400mg  in the am and continue 600mg  in pm. She has taken 200/600 since 06/2020. She reports feeling well. She denies seizure activity. She is tolerating meds well. She can't remember the last time she had a migraines. She is not taking rizatriptan very often at all. She has noticed some difficulty with being forgetful, recently. She may do something then forget she did it. She has had more stressors over the past few months. Her mother and father were ill but now doing better. Her cat that was insulin dependant passed away recently. She has started going to the gym. She has worked up to about 20 minutes on the Elliptical 4 days a week.   07/13/20 ALL (Mychart):  Anna Pugh is a 53 y.o. female here today for follow up for seizures and migraines. She continues lacosamide 50mg  BID and carbamazepine 200mg  in am and 600mg  in pm. Carbamazepine dose previously 500mg  and 600mg  but changed during 2020. She is not sure when or why. She has been stable on current AED dose. No seizure activity in the past year. She has not had any migraines. Rizatriptan works well for abortive therapy.   She is in [place] with her dad who is having some health problems. She is not working right now.   01/13/2020 ALL:  Anna Pugh is a 53 y.o. female here today for follow  up for seizures. Last event was on 08/08/2019. She is doing well. She denies recent seizure activity. She is tolerating Vimpat and carbamazepine. She was seen in the ER for a migraine on 11/03/2019. Labs were stable. She reports migraines are stable. She continues acute management with rizatriptan. She continues to work as a Engineer, structural. She is inquiring about being able to drive.   Geophysical data processor: Anna Pugh [phone] (fax)   HISTORY: (copied from my note on  08/08/2019)  Anna Pugh is a 53 y.o. female here today for follow up. She reports concerns of having a seizure today. She was driving this to work around 8am. She reports that she dropped her head and lost consciousness for "a few seconds." She does not think she swerved. She did not have an accident. No convulsive activity. No biting of tongue or incontinence. She reports numbness of lower extremities started after seizure. She was confused but was able to call a friend to pick her up. The friend presents with her today and stats that she seemed completely normal when when picked her up. She reports forgetting to take her medication this morning. She also forgot to take morning medications once last week. She does not usually have any difficulty taking her medications. This was typical for her with previous seizures. She denies headaches. She had a mild headache after seizure but states she has not had any migraines in years. Last seizure was about two years ago.    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 She reports that she has been very anxious about working. She has been worried about her patient getting Covid or having another seizure. She has not slept well recently. She has a cat that wakes her up at night. She is able to care for herself. No difficulty with driving.    HISTORY: (copied from my note on 01/09/2019)   Anna Pugh is a 53 y.o. female here today for follow up. She has been taking 1 capsule of [name] 200mg  and 3 tablets of Tegretol XR 100mg  for a total of 500mg  in the am and 3 capsules of [name] in the evenings.  She has not had any seizure activity or migraines in the past year. She is living alone and able to perform ADL's. She is working full time. She is able to drive without difficulty. She is feeling well today.    HISTORY: (copied from [place] note on 01/22/2018)   Ms. Kuna is a 53 year old female with a history of seizures  and migraine headaches.  She returns today for follow-up.  She reports that she has been doing very well.  Not had any seizure events and no migraines in the last year.  She reports that her current dose of [name] is working well for her.  She is able to complete all ADLs independently.  She operates a Librarian, academic without difficulty.  Denies any changes in her gait or balance.  She returns today for evaluation.   HISTORY Ms. Mecum is a 53 year old left-handed white female with a history of seizures and migraine headaches. The patient has done quite well with both issues, she has not had any migraine headaches since last seen one year ago, she has not had any recurring seizures. She is on maximum doses of [name], she is tolerating the medication well. She denies drowsiness or gait instability. She is not on vitamin D supplementation. She returns for routine reevaluation. No other significant medical issues  have come up since last seen.   REVIEW OF SYSTEMS: Out of a complete 14 system review of symptoms, the patient complains only of the following symptoms, headaches, forgetfulness, depression,   and all other reviewed systems are negative.  ALLERGIES: Allergies  Allergen Reactions   Keppra [Levetiracetam] Rash   Lamotrigine Rash   Topamax [Topiramate] Other (See Comments)    "made me feel so bad"   Azithromycin Other (See Comments)    Disoriented and out of breath     HOME MEDICATIONS: Outpatient Medications Prior to Visit  Medication Sig Dispense Refill   b complex vitamins capsule Take 1 capsule by mouth daily.     cetirizine (ZYRTEC) 10 MG tablet Take 10 mg by mouth daily.     cholecalciferol (VITAMIN D) 1000 units tablet Take 1,000 Units by mouth daily.     Multiple Vitamin (MULTIVITAMIN PO) Take 1 tablet by mouth daily.     triamcinolone cream (KENALOG) 0.1 % Apply 1 Application topically 2 (two) times daily. 45 g 0   lacosamide (VIMPAT) 50 MG TABS tablet Take 1 tablet (50  mg total) by mouth 2 (two) times daily. 180 tablet 1   carbamazepine ([name]) 200 MG 12 hr capsule Take 1 capsule (200mg ) in the am and 3 capsules (600mg ) in the evening 450 capsule 3   dicyclomine (BENTYL) 20 MG tablet Take 1 tablet (20 mg total) by mouth 2 (two) times daily. (Patient not taking: Reported on 08/07/2023) 20 tablet 0   Pediatric Multiple Vit-C-FA (PEDIATRIC MULTIVITAMIN) chewable tablet Chew 1 tablet by mouth daily. (Patient not taking: Reported on 08/07/2023)     rizatriptan (MAXALT) 10 MG tablet Take 1 tablet (10 mg total) by mouth 3 (three) times daily as needed for migraine. May repeat in 2 hours if needed 12 tablet 11   UNABLE TO FIND Med Name: chlorophyll water (Patient not taking: Reported on 08/07/2023)     No facility-administered medications prior to visit.    PAST MEDICAL HISTORY: Past Medical History:  Diagnosis Date   GERD (gastroesophageal reflux disease)    Localization-related (focal) (partial) epilepsy and epileptic syndromes with complex partial seizures, without mention of intractable epilepsy 01/04/2013   Memory deficit    Migraine without aura, without mention of intractable migraine without mention of status migrainosus 01/04/2013   Obesity    Seizures (HCC)     PAST SURGICAL HISTORY: Past Surgical History:  Procedure Laterality Date   TONSILLECTOMY      FAMILY HISTORY: Family History  Problem Relation Age of Onset   Stroke Mother    Hypertension Mother    Migraines Father    Myasthenia gravis Father    Cancer Brother     SOCIAL HISTORY: Social History   Socioeconomic History   Marital status: Single    Spouse name: Not on file   Number of children: 0   Years of education: Bachelor   Highest education level: Not on file  Occupational History    Comment: Employed by [employer]  Tobacco Use   Smoking status: Never   Smokeless tobacco: Never  Vaping Use   Vaping status: Never Used  Substance and Sexual Activity   Alcohol use: No    Drug use: No   Sexual activity: Not Currently  Other Topics Concern   Not on file  Social History Narrative   Patient is single with no children.   Patient is left handed.   Patient has a Bachelor's degree.   Patient drinks 3 cups  daily.   Social Drivers of Corporate investment banker Strain: Not on file  Food Insecurity: Not on file  Transportation Needs: Not on file  Physical Activity: Not on file  Stress: Not on file  Social Connections: Not on file  Intimate Partner Violence: Not on file      PHYSICAL EXAM  Vitals:   08/07/23 1334  BP: 119/66  Pulse: 64  Weight: 228 lb 8 oz (103.6 kg)  Height: 5\' 2"  (1.575 m)      Body mass index is 41.79 kg/m.  Generalized: Well developed, in no acute distress  Cardiology: normal rate and rhythm, no murmur noted Respiratory: clear to auscultation bilaterally  Neurological examination  Mentation: Alert oriented to time, place, history taking. Follows all commands speech and language fluent Cranial nerve II-XII: Pupils were equal round reactive to light. Extraocular movements were full, visual field were full Motor: The motor testing reveals 5 over 5 strength of all 4 extremities. Good symmetric motor tone is noted throughout.  Gait and station: Gait is normal.     DIAGNOSTIC DATA (LABS, IMAGING, TESTING) - I reviewed patient records, labs, notes, testing and imaging myself where available.      No data to display           Lab Results  Component Value Date   WBC 5.8 02/03/2023   HGB 12.4 02/03/2023   HCT 38.4 02/03/2023   MCV 85.0 02/03/2023   PLT 304 02/03/2023      Component Value Date/Time   NA 137 02/03/2023 1145   NA 143 08/08/2022 1442   K 3.9 02/03/2023 1145   CL 104 02/03/2023 1145   CO2 24 02/03/2023 1145   GLUCOSE 92 02/03/2023 1145   BUN 8 02/03/2023 1145   BUN 13 08/08/2022 1442   CREATININE 0.77 02/03/2023 1145   CALCIUM 8.5 (L) 02/03/2023 1145   PROT 7.1 02/03/2023 1145   PROT 7.4  08/08/2022 1442   ALBUMIN 3.6 02/03/2023 1145   ALBUMIN 4.2 08/08/2022 1442   AST 23 02/03/2023 1145   ALT 18 02/03/2023 1145   ALKPHOS 90 02/03/2023 1145   BILITOT 0.2 (L) 02/03/2023 1145   BILITOT <0.2 08/08/2022 1442   GFRNONAA >60 02/03/2023 1145   GFRAA >60 11/03/2019 1547   Lab Results  Component Value Date   CHOL 218 (H) 01/14/2019   HDL 63 01/14/2019   LDLCALC 131 (H) 01/14/2019   TRIG 134 01/14/2019   CHOLHDL 3.5 01/14/2019   No results found for: "HGBA1C" Lab Results  Component Value Date   VITAMINB12 447 01/04/2013   Lab Results  Component Value Date   TSH 2.790 01/04/2013     ASSESSMENT AND PLAN 53 y.o. year old female  has a past medical history of GERD (gastroesophageal reflux disease), Localization-related (focal) (partial) epilepsy and epileptic syndromes with complex partial seizures, without mention of intractable epilepsy (01/04/2013), Memory deficit, Migraine without aura, without mention of intractable migraine without mention of status migrainosus (01/04/2013), Obesity, and Seizures (HCC). here with     ICD-10-CM   1. Migraine without aura and without status migrainosus, not intractable  G43.009     2. Partial epilepsy with impairment of consciousness (HCC)  G40.209 Carbamazepine level, total    Lacosamide    lacosamide (VIMPAT) 50 MG TABS tablet    3. Depression, unspecified depression type  F32.A Ambulatory referral to Psychology    4. Does not have primary care provider  Z75.8 Ambulatory referral to [facility]  Kelsei Defino is doing well, today. She denies recent seizure activity. She is tolerating lacosamide and carbamazepine. We will continue carbamazepine 200 mg in the morning and 600 mg in the evening as well as lacosamide 50mg  BID. She will continue rizatriptan as needed for abortive therapy.  We will update labs, today. I have placed referrals to PCP and psychology. Education material provided for depression. She was encouraged to stay well  hydrated. Well balanced diet and regular exercise encouraged. She will follow up with me in 1 year, sooner if needed.    Orders Placed This Encounter  Procedures   Carbamazepine level, total   Lacosamide   Ambulatory referral to Family Practice    Referral Priority:   Routine    Referral Type:   Consultation    Referral Reason:   Specialty Services Required    Requested Specialty:   Family Medicine    Number of Visits Requested:   1   Ambulatory referral to Psychology    Referral Priority:   Routine    Referral Type:   Psychiatric    Referral Reason:   Specialty Services Required    Requested Specialty:   Psychology    Number of Visits Requested:   1     Meds ordered this encounter  Medications   carbamazepine ([name]) 200 MG 12 hr capsule    Sig: Take 1 capsule (200mg ) in the am and 3 capsules (600mg ) in the evening    Dispense:  450 capsule    Refill:  3    Supervising Provider:   Anson Fret [[id number]]   rizatriptan (MAXALT) 10 MG tablet    Sig: Take 1 tablet (10 mg total) by mouth daily as needed for migraine. May repeat in 2 hours if needed    Dispense:  12 tablet    Refill:  11    Supervising Provider:   Anson Fret [[id number]]   lacosamide (VIMPAT) 50 MG TABS tablet    Sig: Take 1 tablet (50 mg total) by mouth 2 (two) times daily.    Dispense:  180 tablet    Refill:  1    Supervising Provider:   Anson Fret [[id number]]      ZHY QMVHQ, FNP-C 08/07/2023, 2:24 PM [facility] [address], [address] [place], [place] [address] [phone]

## 2023-08-07 ENCOUNTER — Encounter: Payer: Self-pay | Admitting: Family Medicine

## 2023-08-07 ENCOUNTER — Ambulatory Visit: Payer: 59 | Admitting: Family Medicine

## 2023-08-07 VITALS — BP 119/66 | HR 64 | Ht 62.0 in | Wt 228.5 lb

## 2023-08-07 DIAGNOSIS — Z758 Other problems related to medical facilities and other health care: Secondary | ICD-10-CM | POA: Diagnosis not present

## 2023-08-07 DIAGNOSIS — G40209 Localization-related (focal) (partial) symptomatic epilepsy and epileptic syndromes with complex partial seizures, not intractable, without status epilepticus: Secondary | ICD-10-CM

## 2023-08-07 DIAGNOSIS — G43009 Migraine without aura, not intractable, without status migrainosus: Secondary | ICD-10-CM

## 2023-08-07 DIAGNOSIS — F32A Depression, unspecified: Secondary | ICD-10-CM

## 2023-08-07 MED ORDER — LACOSAMIDE 50 MG PO TABS
50.0000 mg | ORAL_TABLET | Freq: Two times a day (BID) | ORAL | 1 refills | Status: DC
Start: 2023-08-07 — End: 2024-03-04

## 2023-08-07 MED ORDER — RIZATRIPTAN BENZOATE 10 MG PO TABS: 10.0000 mg | ORAL_TABLET | Freq: Every day | ORAL | 11 refills | Status: AC | PRN

## 2023-08-07 MED ORDER — CARBAMAZEPINE ER 200 MG PO CP12: ORAL_CAPSULE | ORAL | 3 refills | Status: DC

## 2023-08-08 ENCOUNTER — Telehealth: Payer: Self-pay | Admitting: General Practice

## 2023-08-08 ENCOUNTER — Telehealth: Payer: Self-pay | Admitting: Family Medicine

## 2023-08-08 NOTE — Telephone Encounter (Signed)
 Called patient we received a referral for patient to establish care with our office no answer left voicemail if patient calls back its okay to schedule

## 2023-08-08 NOTE — Telephone Encounter (Signed)
 Referral for family practice fax to [facility]. Phone: [phone], Fax: [phone]

## 2023-08-10 ENCOUNTER — Encounter: Payer: Self-pay | Admitting: Family Medicine

## 2023-08-10 LAB — LACOSAMIDE: Lacosamide: 1.5 ug/mL — ABNORMAL LOW (ref 5.0–10.0)

## 2023-08-10 LAB — CARBAMAZEPINE LEVEL, TOTAL: Carbamazepine (Tegretol), S: 8.8 ug/mL (ref 4.0–12.0)

## 2023-08-22 ENCOUNTER — Encounter: Payer: Self-pay | Admitting: Family Medicine

## 2023-08-22 ENCOUNTER — Other Ambulatory Visit: Payer: Self-pay | Admitting: Family Medicine

## 2023-08-22 DIAGNOSIS — G40209 Localization-related (focal) (partial) symptomatic epilepsy and epileptic syndromes with complex partial seizures, not intractable, without status epilepticus: Secondary | ICD-10-CM

## 2023-08-23 ENCOUNTER — Encounter: Payer: Self-pay | Admitting: Podiatry

## 2023-08-23 ENCOUNTER — Ambulatory Visit: Admitting: Podiatry

## 2023-08-23 VITALS — Ht 62.0 in | Wt 228.0 lb

## 2023-08-23 DIAGNOSIS — M722 Plantar fascial fibromatosis: Secondary | ICD-10-CM

## 2023-08-23 MED ORDER — TRIAMCINOLONE ACETONIDE 10 MG/ML IJ SUSP
10.0000 mg | Freq: Once | INTRAMUSCULAR | Status: AC
Start: 2023-08-23 — End: 2023-08-23
  Administered 2023-08-23: 10 mg via INTRA_ARTICULAR

## 2023-08-24 NOTE — Progress Notes (Signed)
 Subjective:   Patient ID: Anna Pugh, female   DOB: 53 y.o.   MRN: [id number]   HPI Patient states her left heel has been hurting her a lot and it comes and goes but over the last couple months it has been very sore.  Patient does not smoke likes to be active   Review of Systems  All other systems reviewed and are negative.       Objective:  Physical Exam Vitals and nursing note reviewed.  Constitutional:      Appearance: She is well-developed.  Pulmonary:     Effort: Pulmonary effort is normal.  Musculoskeletal:        General: Normal range of motion.  Skin:    General: Skin is warm.  Neurological:     Mental Status: She is alert.     Neurovascular status intact muscle strength found to be adequate range of motion adequate exquisite discomfort plantar aspect left heel at the insertional point tendon calcaneus with inflammation fluid around the medial band.  Patient found to have good digital perfusion well-oriented x 3     Assessment:  Acute fasciitis left with inflammation fluid of the medial band     Plan:  H&P reviewed and discussed condition.  Sterile prep injected the fascia at insertion 3 mg Kenalog 5 mg Xylocaine advised on stretching activities supportive shoe gear and patient will be seen back as needed with hopefully this solve the problem

## 2023-08-29 ENCOUNTER — Ambulatory Visit: Admitting: Neurology

## 2023-08-29 ENCOUNTER — Encounter: Payer: Self-pay | Admitting: Family Medicine

## 2023-08-29 DIAGNOSIS — G40209 Localization-related (focal) (partial) symptomatic epilepsy and epileptic syndromes with complex partial seizures, not intractable, without status epilepticus: Secondary | ICD-10-CM | POA: Diagnosis not present

## 2023-08-29 NOTE — Procedures (Signed)
   History:  53 year old woman with epilepsy  EEG classification:  Awake and asleep  Duration: 25 minutes   Technical aspects: This EEG study was done with scalp electrodes positioned according to the 10-20 International system of electrode placement. Electrical activity was reviewed with band pass filter of 1-70Hz , sensitivity of 7 uV/mm, display speed of 47mm/sec with a 60Hz  notched filter applied as appropriate. EEG data were recorded continuously and digitally stored.   Description of the recording: The background rhythms of this recording consists of a fairly well modulated medium amplitude background activity of 8.5-9 Hz. As the record progresses, the patient initially is in the waking state, but appears to enter the early stage II sleep during the recording, with rudimentary sleep spindles and vertex sharp wave activity seen. During the wakeful state, photic stimulation was performed, and no abnormal responses were seen. Hyperventilation was also performed, no abnormal response seen. No epileptiform discharges seen during this recording. There was no focal slowing.   Abnormality: None   Impression: This is a normal awake and sleep EEG. No evidence of interictal epileptiform discharges. Normal EEGs, however, do not rule out epilepsy.    Taline Nass, MD [facility]

## 2023-10-16 ENCOUNTER — Encounter: Payer: Self-pay | Admitting: Family

## 2023-10-18 ENCOUNTER — Ambulatory Visit: Admitting: Podiatry

## 2023-10-18 ENCOUNTER — Telehealth: Payer: Self-pay | Admitting: Family Medicine

## 2023-10-18 ENCOUNTER — Ambulatory Visit (INDEPENDENT_AMBULATORY_CARE_PROVIDER_SITE_OTHER)

## 2023-10-18 ENCOUNTER — Encounter: Payer: Self-pay | Admitting: Podiatry

## 2023-10-18 VITALS — Ht 62.0 in | Wt 228.0 lb

## 2023-10-18 DIAGNOSIS — M216X1 Other acquired deformities of right foot: Secondary | ICD-10-CM

## 2023-10-18 DIAGNOSIS — M216X2 Other acquired deformities of left foot: Secondary | ICD-10-CM

## 2023-10-18 DIAGNOSIS — M722 Plantar fascial fibromatosis: Secondary | ICD-10-CM

## 2023-10-18 DIAGNOSIS — M7662 Achilles tendinitis, left leg: Secondary | ICD-10-CM

## 2023-10-18 MED ORDER — CARBAMAZEPINE ER 200 MG PO CP12: ORAL_CAPSULE | ORAL | 3 refills | Status: AC

## 2023-10-18 MED ORDER — TRIAMCINOLONE ACETONIDE 10 MG/ML IJ SUSP
10.0000 mg | Freq: Once | INTRAMUSCULAR | Status: AC
Start: 2023-10-18 — End: 2023-10-18
  Administered 2023-10-18: 10 mg via INTRA_ARTICULAR

## 2023-10-18 NOTE — Patient Instructions (Signed)

## 2023-10-18 NOTE — Telephone Encounter (Addendum)
 Last seen on 08/07/23 Follow up scheduled on 08/06/24 Rx sent

## 2023-10-18 NOTE — Telephone Encounter (Signed)
 Pt called to request medication  Refill carbamazepine  ([name] ) 200 MG 12 hr capsule  . Pt states Pharmacy informed her she is put and need more   Pharmacy : .[id number] - [place], [place] - [address] AT [facility] [address] (Ph: [phone])

## 2023-10-18 NOTE — Progress Notes (Signed)
 Subjective:   Patient ID: Anna Pugh, female   DOB: 53 y.o.   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

## 2023-10-19 NOTE — Progress Notes (Signed)
 Orthotic Scan  Foam impression from office visit was scanned into [facility].  Accomodative full length device ordered with deep heel seat and 1/4 inch (6mm) heel lift bilateral per Dr. Merle Starcher note.    Allecia will be contacted when orthotics are ready for fitting.

## 2023-10-24 ENCOUNTER — Encounter: Payer: Self-pay | Admitting: Family

## 2023-10-25 ENCOUNTER — Ambulatory Visit (INDEPENDENT_AMBULATORY_CARE_PROVIDER_SITE_OTHER): Admitting: Family

## 2023-10-25 ENCOUNTER — Encounter: Payer: Self-pay | Admitting: Family

## 2023-10-25 VITALS — BP 115/80 | HR 64 | Temp 97.9°F | Resp 16 | Ht 62.0 in | Wt 227.8 lb

## 2023-10-25 DIAGNOSIS — F32A Depression, unspecified: Secondary | ICD-10-CM

## 2023-10-25 DIAGNOSIS — Z6841 Body Mass Index (BMI) 40.0 and over, adult: Secondary | ICD-10-CM | POA: Diagnosis not present

## 2023-10-25 DIAGNOSIS — R635 Abnormal weight gain: Secondary | ICD-10-CM

## 2023-10-25 DIAGNOSIS — Z7689 Persons encountering health services in other specified circumstances: Secondary | ICD-10-CM

## 2023-10-25 MED ORDER — PHENTERMINE HCL 15 MG PO CAPS
15.0000 mg | ORAL_CAPSULE | ORAL | 0 refills | Status: DC
Start: 2023-10-25 — End: 2024-03-21

## 2023-10-25 NOTE — Progress Notes (Signed)
 Little bit of depression, patient would love to have guidance on losing weight,  would like to get back into exercising and a diet

## 2023-10-25 NOTE — Progress Notes (Signed)
 Subjective:    Anna Pugh - 53 y.o. female MRN [id number]  Date of birth: May 04, 1971  HPI  Anna Pugh is to establish care.   Current issues and/or concerns: - Depression. States she thinks she might be gay. States she is concerned because she never considered this could happen at her age. States she feels like she is making people at work feel uncomfortable because of how she may look at them. States she participates in a Support Group once monthly which helps. States she plans to schedule an appointment with her therapist soon. She denies thoughts of self-harm, suicidal ideations, homicidal ideations. - Weight gain. She is trying to watch what she eats. She exercises. She would like to try a weight loss medication to see if this helps and referral to a weight specialist.  - Established with Neurology.  - Established with Podiatry. - No further issues/concerns for discussion today.  ROS per HPI     Health Maintenance:   Health Maintenance Due  Topic Date Due   HIV Screening  Never done   Hepatitis C Screening  Never done   Colonoscopy  Never done   MAMMOGRAM  Never done     Past Medical History: Patient Active Problem List   Diagnosis Date Noted   Fatigue 10/09/2018   Advice given about COVID-19 virus by telephone 10/09/2018   Partial epilepsy with impairment of consciousness (HCC) 01/04/2013   Migraine without aura 01/04/2013      Social History   reports that she has never smoked. She has never used smokeless tobacco. She reports that she does not drink alcohol and does not use drugs.   Family History  family history includes Cancer in her brother; Hypertension in her mother; Migraines in her father; Myasthenia gravis in her father; Stroke in her mother.   Medications: reviewed and updated   Objective:   Physical Exam BP 115/80   Pulse 64   Temp 97.9 F (36.6 C) (Oral)   Resp 16   Ht 5' 2 (1.575 m)   Wt 227 lb 12.8 oz (103.3 kg)   SpO2 95%   BMI  41.67 kg/m   Physical Exam HENT:     Head: Normocephalic and atraumatic.     Nose: Nose normal.     Mouth/Throat:     Mouth: Mucous membranes are moist.     Pharynx: Oropharynx is clear.  Eyes:     Extraocular Movements: Extraocular movements intact.     Conjunctiva/sclera: Conjunctivae normal.     Pupils: Pupils are equal, round, and reactive to light.  Cardiovascular:     Rate and Rhythm: Normal rate and regular rhythm.     Pulses: Normal pulses.     Heart sounds: Normal heart sounds.  Pulmonary:     Effort: Pulmonary effort is normal.     Breath sounds: Normal breath sounds.  Musculoskeletal:        General: Normal range of motion.     Cervical back: Normal range of motion and neck supple.  Neurological:     General: No focal deficit present.     Mental Status: She is alert and oriented to person, place, and time.  Psychiatric:        Mood and Affect: Mood normal.        Behavior: Behavior normal.       Assessment & Plan:  1. Encounter to establish care (Primary) - Patient presents today to establish care. During the interim follow-up with primary provider as  scheduled.  - Return for annual physical examination, labs, and health maintenance. Arrive fasting meaning having no food for at least 8 hours prior to appointment. You may have only water or black coffee. Please take scheduled medications as normal.  2. Depression, unspecified depression type - Patient denies thoughts of self-harm, suicidal ideations, homicidal ideations. - Patient declined pharmacological therapy.  - Patient declined referral to Psychiatry. - Keep all scheduled appointments with therapist and Support Group.  - Follow-up with primary provider as scheduled.  3. Encounter for weight management 4. BMI 40.0-44.9, adult (HCC) 5. Weight gain - Phentermine as prescribed. Counseled on medication adherence and adverse effects.  - I did check the [place]  prescription drug database. - Counseled  on low-sodium DASH diet and 150 minutes of moderate intensity exercise per week as tolerated to assist with weight management.  - Referral to Medical Weight Management for evaluation/management. - Follow-up with primary provider in 4 weeks or sooner if needed. - Amb Ref to Medical Weight Management - phentermine 15 MG capsule; Take 1 capsule (15 mg total) by mouth every morning.  Dispense: 30 capsule; Refill: 0    Patient was given clear instructions to go to Emergency Department or return to medical center if symptoms don't improve, worsen, or new problems develop.The patient verbalized understanding.  I discussed the assessment and treatment plan with the patient. The patient was provided an opportunity to ask questions and all were answered. The patient agreed with the plan and demonstrated an understanding of the instructions.   The patient was advised to call back or seek an in-person evaluation if the symptoms worsen or if the condition fails to improve as anticipated.    Lavona Pounds, NP 10/25/2023, 5:16 PM Primary Care at [facility]

## 2023-11-09 ENCOUNTER — Telehealth: Payer: Self-pay | Admitting: Podiatry

## 2023-11-09 NOTE — Telephone Encounter (Signed)
 LVM to notify pt orthotics are here and to confirm appt

## 2023-11-15 ENCOUNTER — Ambulatory Visit (INDEPENDENT_AMBULATORY_CARE_PROVIDER_SITE_OTHER): Admitting: Family

## 2023-11-15 ENCOUNTER — Encounter: Payer: Self-pay | Admitting: Family

## 2023-11-15 VITALS — BP 125/84 | HR 65 | Temp 97.9°F | Resp 18 | Ht 62.0 in | Wt 222.2 lb

## 2023-11-15 DIAGNOSIS — Z1159 Encounter for screening for other viral diseases: Secondary | ICD-10-CM

## 2023-11-15 DIAGNOSIS — Z13228 Encounter for screening for other metabolic disorders: Secondary | ICD-10-CM | POA: Diagnosis not present

## 2023-11-15 DIAGNOSIS — Z6841 Body Mass Index (BMI) 40.0 and over, adult: Secondary | ICD-10-CM | POA: Diagnosis not present

## 2023-11-15 DIAGNOSIS — E669 Obesity, unspecified: Secondary | ICD-10-CM

## 2023-11-15 DIAGNOSIS — Z7689 Persons encountering health services in other specified circumstances: Secondary | ICD-10-CM

## 2023-11-15 DIAGNOSIS — Z131 Encounter for screening for diabetes mellitus: Secondary | ICD-10-CM

## 2023-11-15 DIAGNOSIS — Z808 Family history of malignant neoplasm of other organs or systems: Secondary | ICD-10-CM

## 2023-11-15 DIAGNOSIS — R229 Localized swelling, mass and lump, unspecified: Secondary | ICD-10-CM

## 2023-11-15 DIAGNOSIS — Z1329 Encounter for screening for other suspected endocrine disorder: Secondary | ICD-10-CM | POA: Diagnosis not present

## 2023-11-15 DIAGNOSIS — Z13220 Encounter for screening for lipoid disorders: Secondary | ICD-10-CM | POA: Diagnosis not present

## 2023-11-15 DIAGNOSIS — Z114 Encounter for screening for human immunodeficiency virus [HIV]: Secondary | ICD-10-CM

## 2023-11-15 DIAGNOSIS — Z0000 Encounter for general adult medical examination without abnormal findings: Secondary | ICD-10-CM | POA: Diagnosis not present

## 2023-11-15 DIAGNOSIS — Z1211 Encounter for screening for malignant neoplasm of colon: Secondary | ICD-10-CM

## 2023-11-15 DIAGNOSIS — Z1231 Encounter for screening mammogram for malignant neoplasm of breast: Secondary | ICD-10-CM

## 2023-11-15 DIAGNOSIS — Z130 Encounter for screening for diseases of the blood and blood-forming organs and certain disorders involving the immune mechanism: Secondary | ICD-10-CM

## 2023-11-15 NOTE — Progress Notes (Signed)
 Spot on her back  and skin cancer runs in her family so she is concerns

## 2023-11-15 NOTE — Progress Notes (Signed)
 Patient ID: Anna Pugh, female    DOB: Jun 19, 1970  MRN: [id number]  CC: Annual Exam  Subjective: Anna Pugh is a 53 y.o. female who presents for annual exam.  Her concerns today include:  - Up to date on cervical cancer screening per [facility].  - States she prefers Cologuard. - Skin nodules on back. Denies red flag symptoms. Family history of skin cancer.  - States she never began taking Phentermine  and lost 5 pounds since previous office visit.   Patient Active Problem List   Diagnosis Date Noted   Fatigue 10/09/2018   Advice given about COVID-19 virus by telephone 10/09/2018   Partial epilepsy with impairment of consciousness (HCC) 01/04/2013   Migraine without aura 01/04/2013     Current Outpatient Medications on File Prior to Visit  Medication Sig Dispense Refill   b complex vitamins capsule Take 1 capsule by mouth daily.     carbamazepine  ([name] ) 200 MG 12 hr capsule Take 1 capsule (200mg ) in the am and 3 capsules (600mg ) in the evening 450 capsule 3   cetirizine (ZYRTEC) 10 MG tablet Take 10 mg by mouth daily.     cholecalciferol (VITAMIN D) 1000 units tablet Take 1,000 Units by mouth daily.     lacosamide  (VIMPAT ) 50 MG TABS tablet Take 1 tablet (50 mg total) by mouth 2 (two) times daily. 180 tablet 1   Multiple Vitamin (MULTIVITAMIN PO) Take 1 tablet by mouth daily.     rizatriptan  (MAXALT ) 10 MG tablet Take 1 tablet (10 mg total) by mouth daily as needed for migraine. May repeat in 2 hours if needed 12 tablet 11   triamcinolone  cream (KENALOG ) 0.1 % Apply 1 Application topically 2 (two) times daily. 45 g 0   phentermine  15 MG capsule Take 1 capsule (15 mg total) by mouth every morning. (Patient not taking: Reported on 11/15/2023) 30 capsule 0   No current facility-administered medications on file prior to visit.    Allergies  Allergen Reactions   Keppra [Levetiracetam] Rash   Lamotrigine Rash   Topamax [Topiramate] Other (See Comments)    made me feel so  bad   Azithromycin Other (See Comments)    Disoriented and out of breath     Social History   Socioeconomic History   Marital status: Single    Spouse name: Not on file   Number of children: 0   Years of education: Bachelor   Highest education level: Bachelor's degree (e.g., BA, AB, BS)  Occupational History    Comment: Employed by [employer]  Tobacco Use   Smoking status: Never   Smokeless tobacco: Never  Vaping Use   Vaping status: Never Used  Substance and Sexual Activity   Alcohol use: No   Drug use: No   Sexual activity: Not Currently  Other Topics Concern   Not on file  Social History Narrative   Patient is single with no children.   Patient is left handed.   Patient has a Bachelor's degree.   Patient drinks 3 cups daily.   Social Drivers of Corporate investment banker Strain: Low Risk  (11/12/2023)   Overall Financial Resource Strain (CARDIA)    Difficulty of Paying Living Expenses: Not hard at all  Food Insecurity: No Food Insecurity (11/12/2023)   Hunger Vital Sign    Worried About Running Out of Food in the Last Year: Never true    Ran Out of Food in the Last Year: Never true  Transportation Needs:  No Transportation Needs (11/12/2023)   PRAPARE - Administrator, Civil Service (Medical): No    Lack of Transportation (Non-Medical): No  Physical Activity: Insufficiently Active (11/12/2023)   Exercise Vital Sign    Days of Exercise per Week: 3 days    Minutes of Exercise per Session: 30 min  Stress: Stress Concern Present (11/12/2023)   [employer] of Occupational Health - Occupational Stress Questionnaire    Feeling of Stress: To some extent  Social Connections: Moderately Integrated (11/12/2023)   Social Connection and Isolation Panel    Frequency of Communication with Friends and Family: More than three times a week    Frequency of Social Gatherings with Friends and Family: Once a week    Attends Religious Services: More than 4 times per  year    Active Member of [employer] or Organizations: Yes    Attends Banker Meetings: More than 4 times per year    Marital Status: Never married  Intimate Partner Violence: Not At Risk (10/25/2023)   Humiliation, Afraid, Rape, and Kick questionnaire    Fear of Current or Ex-Partner: No    Emotionally Abused: No    Physically Abused: No    Sexually Abused: No    Family History  Problem Relation Age of Onset   Stroke Mother    Hypertension Mother    Migraines Father    Myasthenia gravis Father    Cancer Brother     Past Surgical History:  Procedure Laterality Date   TONSILLECTOMY      ROS: Review of Systems Negative except as stated above  PHYSICAL EXAM: BP 125/84   Pulse 65   Temp 97.9 F (36.6 C) (Oral)   Resp 18   Ht 5' 2 (1.575 m)   Wt 222 lb 3.2 oz (100.8 kg)   LMP 10/24/2023 (Exact Date)   SpO2 96%   BMI 40.64 kg/m   Wt Readings from Last 3 Encounters:  11/15/23 222 lb 3.2 oz (100.8 kg)  10/25/23 227 lb 12.8 oz (103.3 kg)  10/18/23 228 lb (103.4 kg)   Physical Exam HENT:     Head: Normocephalic and atraumatic.     Right Ear: Tympanic membrane, ear canal and external ear normal.     Left Ear: Tympanic membrane, ear canal and external ear normal.     Nose: Nose normal.     Mouth/Throat:     Mouth: Mucous membranes are moist.     Pharynx: Oropharynx is clear.  Eyes:     Extraocular Movements: Extraocular movements intact.     Conjunctiva/sclera: Conjunctivae normal.     Pupils: Pupils are equal, round, and reactive to light.  Neck:     Thyroid: No thyroid mass, thyromegaly or thyroid tenderness.  Cardiovascular:     Rate and Rhythm: Normal rate and regular rhythm.     Pulses: Normal pulses.     Heart sounds: Normal heart sounds.  Pulmonary:     Effort: Pulmonary effort is normal.     Breath sounds: Normal breath sounds.  Chest:     Comments: Patient declined. Abdominal:     General: Bowel sounds are normal.     Palpations: Abdomen  is soft.  Genitourinary:    Comments: Patient declined. Musculoskeletal:        General: Normal range of motion.     Right shoulder: Normal.     Left shoulder: Normal.     Right upper arm: Normal.     Left upper  arm: Normal.     Right elbow: Normal.     Left elbow: Normal.     Right forearm: Normal.     Left forearm: Normal.     Right wrist: Normal.     Left wrist: Normal.     Right hand: Normal.     Left hand: Normal.     Cervical back: Normal, normal range of motion and neck supple.     Thoracic back: Normal.     Lumbar back: Normal.     Right hip: Normal.     Left hip: Normal.     Right upper leg: Normal.     Left upper leg: Normal.     Right knee: Normal.     Left knee: Normal.     Right lower leg: Normal.     Left lower leg: Normal.     Right ankle: Normal.     Left ankle: Normal.     Right foot: Normal.     Left foot: Normal.  Skin:    General: Skin is warm and dry.     Capillary Refill: Capillary refill takes less than 2 seconds.     Comments: Two hyperpigmented skin nodules on back, no drainage.  Neurological:     General: No focal deficit present.     Mental Status: She is alert and oriented to person, place, and time.  Psychiatric:        Mood and Affect: Mood normal.        Behavior: Behavior normal.    ASSESSMENT AND PLAN: 1. Annual physical exam (Primary) - Counseled on 150 minutes of exercise per week as tolerated, healthy eating (including decreased daily intake of saturated fats, cholesterol, added sugars, sodium), STI prevention, and routine healthcare maintenance.  2. Screening for metabolic disorder - Routine screening.  - CMP14+EGFR  3. Screening for deficiency anemia - Routine screening.  - CBC  4. Diabetes mellitus screening - Routine screening.  - Hemoglobin A1c  5. Screening cholesterol level - Routine screening.  - Lipid panel  6. Thyroid disorder screen - Routine screening.  - TSH  7. Encounter for screening for HIV -  Routine screening.  - HIV antibody (with reflex)  8. Need for hepatitis C screening test - Routine screening.  - Hepatitis C Antibody  9. Encounter for screening mammogram for malignant neoplasm of breast - Routine screening.  - MM Digital Screening; Future  10. Colon cancer screening - Routine screening.  - Cologuard  11. Skin nodule 12. Family history of skin cancer - Referral to Dermatology for evaluation/management. - Ambulatory referral to Dermatology  13. Encounter for weight management 14. BMI 40.0-44.9, adult [facility]) - Patient lost 5 pounds since previous office visit.  - Patient states she never began taking Phentermine  and declines refill presently.  - Follow-up with primary provider as scheduled.   Patient was given the opportunity to ask questions.  Patient verbalized understanding of the plan and was able to repeat key elements of the plan. Patient was given clear instructions to go to Emergency Department or return to medical center if symptoms don't improve, worsen, or new problems develop.The patient verbalized understanding.   Orders Placed This Encounter  Procedures   MM Digital Screening   Cologuard   CBC   Lipid panel   CMP14+EGFR   Hemoglobin A1c   TSH   HIV antibody (with reflex)   Hepatitis C Antibody   Ambulatory referral to Dermatology   Return in about 1 year (around  11/14/2024) for Physical per patient preference.  Greig JINNY Drones, NP

## 2023-11-17 LAB — CMP14+EGFR
ALT: 21 IU/L (ref 0–32)
AST: 22 IU/L (ref 0–40)
Albumin: 4.5 g/dL (ref 3.8–4.9)
Alkaline Phosphatase: 113 IU/L (ref 44–121)
BUN/Creatinine Ratio: 15 (ref 9–23)
BUN: 11 mg/dL (ref 6–24)
Bilirubin Total: <0.2 mg/dL (ref 0.0–1.2)
CO2: 22 mmol/L (ref 20–29)
Calcium: 9.4 mg/dL (ref 8.7–10.2)
Chloride: 102 mmol/L (ref 96–106)
Creatinine, Ser: 0.74 mg/dL (ref 0.57–1.00)
Globulin, Total: 3.1 g/dL (ref 1.5–4.5)
Glucose: 90 mg/dL (ref 70–99)
Potassium: 4.4 mmol/L (ref 3.5–5.2)
Sodium: 140 mmol/L (ref 134–144)
Total Protein: 7.6 g/dL (ref 6.0–8.5)
eGFR: 97 mL/min/1.73 (ref >59)

## 2023-11-17 LAB — TSH: TSH: 1.82 u[IU]/mL (ref 0.450–4.500)

## 2023-11-17 LAB — HEPATITIS C ANTIBODY: Hep C Virus Ab: NONREACTIVE

## 2023-11-17 LAB — CBC
Hematocrit: 43.4 % (ref 34.0–46.6)
Hemoglobin: 13.3 g/dL (ref 11.1–15.9)
MCH: 28.1 pg (ref 26.6–33.0)
MCHC: 30.6 g/dL — ABNORMAL LOW (ref 31.5–35.7)
MCV: 92 fL (ref 79–97)
Platelets: 341 x10E3/uL (ref 150–450)
RBC: 4.74 x10E6/uL (ref 3.77–5.28)
RDW: 13.8 % (ref 11.7–15.4)
WBC: 6.8 x10E3/uL (ref 3.4–10.8)

## 2023-11-17 LAB — LIPID PANEL
Chol/HDL Ratio: 3.5 ratio (ref 0.0–4.4)
Cholesterol, Total: 257 mg/dL — ABNORMAL HIGH (ref 100–199)
HDL: 73 mg/dL (ref >39)
LDL Chol Calc (NIH): 171 mg/dL — ABNORMAL HIGH (ref 0–99)
Triglycerides: 78 mg/dL (ref 0–149)
VLDL Cholesterol Cal: 13 mg/dL (ref 5–40)

## 2023-11-17 LAB — HIV ANTIBODY (ROUTINE TESTING W REFLEX): HIV Screen 4th Generation wRfx: NONREACTIVE

## 2023-11-17 LAB — HEMOGLOBIN A1C
Est. average glucose Bld gHb Est-mCnc: 114 mg/dL
Hgb A1c MFr Bld: 5.6 % (ref 4.8–5.6)

## 2023-11-20 ENCOUNTER — Ambulatory Visit: Payer: Self-pay | Admitting: Family

## 2023-11-20 DIAGNOSIS — E785 Hyperlipidemia, unspecified: Secondary | ICD-10-CM

## 2023-11-20 MED ORDER — ATORVASTATIN CALCIUM 20 MG PO TABS
20.0000 mg | ORAL_TABLET | Freq: Every day | ORAL | 0 refills | Status: DC
Start: 2023-11-20 — End: 2024-02-21

## 2023-11-29 ENCOUNTER — Other Ambulatory Visit

## 2023-12-08 LAB — COLOGUARD: COLOGUARD: NEGATIVE

## 2023-12-12 ENCOUNTER — Ambulatory Visit (INDEPENDENT_AMBULATORY_CARE_PROVIDER_SITE_OTHER): Admitting: Adult Health

## 2023-12-12 ENCOUNTER — Encounter (INDEPENDENT_AMBULATORY_CARE_PROVIDER_SITE_OTHER): Payer: Self-pay | Admitting: Adult Health

## 2023-12-12 VITALS — BP 109/65 | HR 60 | Temp 97.8°F | Ht 62.0 in | Wt 220.0 lb

## 2023-12-12 DIAGNOSIS — E785 Hyperlipidemia, unspecified: Secondary | ICD-10-CM | POA: Diagnosis not present

## 2023-12-12 DIAGNOSIS — Z7689 Persons encountering health services in other specified circumstances: Secondary | ICD-10-CM

## 2023-12-12 DIAGNOSIS — Z1329 Encounter for screening for other suspected endocrine disorder: Secondary | ICD-10-CM

## 2023-12-12 DIAGNOSIS — Z6841 Body Mass Index (BMI) 40.0 and over, adult: Secondary | ICD-10-CM | POA: Diagnosis not present

## 2023-12-12 DIAGNOSIS — Z131 Encounter for screening for diabetes mellitus: Secondary | ICD-10-CM

## 2023-12-12 DIAGNOSIS — E559 Vitamin D deficiency, unspecified: Secondary | ICD-10-CM

## 2023-12-12 NOTE — Progress Notes (Signed)
 Office: [phone]  /  Fax: [phone]   Initial Visit    Lonnette Divelbiss was seen in clinic today to evaluate for obesity. She is interested in losing weight to improve overall health and reduce the risk of weight related complications. She presents today to review program treatment options, initial physical assessment, and evaluation.     She was referred by: PCP  When asked what else they would like to accomplish? She states: Adopt a healthier eating pattern and lifestyle, Improve energy levels and physical activity, Improve existing medical conditions, Improve quality of life, and Be Healthier  When asked how has your weight affected you? She states: Having fatigue  Weight history: Elevated BMI since childhood  Highest weight: 228 lbs  Some associated conditions: Vit D Def  Contributing factors: family history of obesity, disruption of circadian rhythm / sleep disordered breathing, consumption of processed foods, use of obesogenic medications: Antiepileptics, reduced physical activity, and need for convenient foods  Weight promoting medications identified: Antiepileptics  Prior weight loss attempts: None  Current nutrition plan: None  Current level of physical activity: None and Other: But historically goes to [employer]  Current or previous pharmacotherapy: None  Response to medication: Never tried medications   Past medical history includes:   Past Medical History:  Diagnosis Date   GERD (gastroesophageal reflux disease)    Localization-related (focal) (partial) epilepsy and epileptic syndromes with complex partial seizures, without mention of intractable epilepsy 01/04/2013   Memory deficit    Migraine without aura, without mention of intractable migraine without mention of status migrainosus 01/04/2013   Obesity    Seizures (HCC)      Objective    BP 109/65   Pulse 60   Temp 97.8 F (36.6 C)   Ht 5' 2 (1.575 m)   Wt 220 lb (99.8 kg)   LMP 10/24/2023  (Exact Date)   SpO2 99%   BMI 40.24 kg/m  She was weighed on the bioimpedance scale: Body mass index is 40.24 kg/m.  Body Fat%:51.2, Visceral Fat Rating:15, Weight trend over the last 12 months: Decreasing  General:  Alert, oriented and cooperative. Patient is in no acute distress.  Respiratory: Normal respiratory effort, no problems with respiration noted   Gait: able to ambulate independently  Mental Status: Normal mood and affect. Normal behavior. Normal judgment and thought content.   DIAGNOSTIC DATA REVIEWED:  BMET    Component Value Date/Time   NA 140 11/15/2023 1407   K 4.4 11/15/2023 1407   CL 102 11/15/2023 1407   CO2 22 11/15/2023 1407   GLUCOSE 90 11/15/2023 1407   GLUCOSE 92 02/03/2023 1145   BUN 11 11/15/2023 1407   CREATININE 0.74 11/15/2023 1407   CALCIUM  9.4 11/15/2023 1407   GFRNONAA >60 02/03/2023 1145   GFRAA >60 11/03/2019 1547   Lab Results  Component Value Date   HGBA1C 5.6 11/15/2023   No results found for: INSULIN CBC    Component Value Date/Time   WBC 6.8 11/15/2023 1407   WBC 5.8 02/03/2023 1145   RBC 4.74 11/15/2023 1407   RBC 4.52 02/03/2023 1145   HGB 13.3 11/15/2023 1407   HCT 43.4 11/15/2023 1407   PLT 341 11/15/2023 1407   MCV 92 11/15/2023 1407   MCH 28.1 11/15/2023 1407   MCH 27.4 02/03/2023 1145   MCHC 30.6 (L) 11/15/2023 1407   MCHC 32.3 02/03/2023 1145   RDW 13.8 11/15/2023 1407   Iron/TIBC/Ferritin/ %Sat No results found for: IRON, TIBC, FERRITIN, IRONPCTSAT Lipid  Panel     Component Value Date/Time   CHOL 257 (H) 11/15/2023 1407   TRIG 78 11/15/2023 1407   HDL 73 11/15/2023 1407   CHOLHDL 3.5 11/15/2023 1407   LDLCALC 171 (H) 11/15/2023 1407   Hepatic Function Panel     Component Value Date/Time   PROT 7.6 11/15/2023 1407   ALBUMIN 4.5 11/15/2023 1407   AST 22 11/15/2023 1407   ALT 21 11/15/2023 1407   ALKPHOS 113 11/15/2023 1407   BILITOT <0.2 11/15/2023 1407      Component Value Date/Time    TSH 1.820 11/15/2023 1407     Assessment and Plan   Thyroid disorder screen  Diabetes mellitus screening  Hyperlipidemia, unspecified hyperlipidemia type  Vitamin D deficiency  Encounter for weight management  BMI 40.0-44.9, adult (HCC)    Assessment and Plan          ESTABLISH WITH [facility]   Obesity Treatment / Action Plan:  Patient will work on garnering support from family and friends to begin weight loss journey. Will work on eliminating or reducing the presence of highly palatable, calorie dense foods in the home. Will complete provided nutritional and psychosocial assessment questionnaire before the next appointment. Will be scheduled for indirect calorimetry to determine resting energy expenditure in a fasting state.  This will allow us  to create a reduced calorie, high-protein meal plan to promote loss of fat mass while preserving muscle mass. Counseled on the health benefits of losing 5%-15% of total body weight. Was counseled on nutritional approaches to weight loss and benefits of reducing processed foods and consuming plant-based foods and high quality protein as part of nutritional weight management. Was counseled on pharmacotherapy and role as an adjunct in weight management.   Obesity Education Performed Today:  She was weighed on the bioimpedance scale and results were discussed and documented in the synopsis.  We discussed obesity as a disease and the importance of a more detailed evaluation of all the factors contributing to the disease.  We discussed the importance of long term lifestyle changes which include nutrition, exercise and behavioral modifications as well as the importance of customizing this to her specific health and social needs.  We discussed the benefits of reaching a healthier weight to alleviate the symptoms of existing conditions and reduce the risks of the biomechanical, metabolic and psychological effects of obesity.  We reviewed the  four pillars of obesity medicine and importance of using a multimodal approach.  We reviewed the basic principles in weight management.   Alethia Mccune appears to be in the action stage of change and states they are ready to start intensive lifestyle modifications and behavioral modifications.  I have spent 25 minutes in the care of the patient today including: 3 minutes before the visit reviewing and preparing the chart. 20 minutes face-to-face assessing and reviewing listed medical problems as outlined in obesity care plan, providing nutritional and behavioral counseling on topics outlined in the obesity care plan, counseling regarding anti-obesity medication as outlined in obesity care plan, independently interpreting test results and goals of care, as described in assessment and plan, and reviewing and discussing biometric information and progress 2 minutes after the visit updating chart and documentation of encounter.  Reviewed by clinician on day of visit: allergies, medications, problem list, medical history, surgical history, family history, social history, and previous encounter notes pertinent to obesity diagnosis.  Nikitia Asbill d. Edithe Dobbin, NP-C

## 2023-12-13 ENCOUNTER — Other Ambulatory Visit

## 2023-12-14 ENCOUNTER — Ambulatory Visit
Admission: RE | Admit: 2023-12-14 | Discharge: 2023-12-14 | Disposition: A | Discharge status: Home / Self Care | Source: Ambulatory Visit | Attending: Family | Admitting: Family

## 2023-12-14 DIAGNOSIS — Z1231 Encounter for screening mammogram for malignant neoplasm of breast: Secondary | ICD-10-CM

## 2023-12-27 ENCOUNTER — Telehealth: Payer: Self-pay | Admitting: Podiatry

## 2023-12-27 NOTE — Telephone Encounter (Signed)
 I called patient and left a message informing her that I mistakenly put her in a new patient slot.That appointment has been cancelled . Patient needs to be rescheduled.

## 2023-12-28 ENCOUNTER — Ambulatory Visit: Admitting: Podiatry

## 2024-01-04 ENCOUNTER — Ambulatory Visit (INDEPENDENT_AMBULATORY_CARE_PROVIDER_SITE_OTHER): Admitting: Podiatry

## 2024-01-04 ENCOUNTER — Encounter: Payer: Self-pay | Admitting: Podiatry

## 2024-01-04 DIAGNOSIS — M722 Plantar fascial fibromatosis: Secondary | ICD-10-CM

## 2024-01-04 DIAGNOSIS — M7662 Achilles tendinitis, left leg: Secondary | ICD-10-CM

## 2024-01-04 MED ORDER — METHYLPREDNISOLONE 4 MG PO TBPK: ORAL_TABLET | ORAL | 0 refills | Status: DC

## 2024-01-04 NOTE — Progress Notes (Signed)
 Subjective:  Patient ID: Anna Pugh, female    DOB: 1970/11/23,  MRN: [id number]  Chief Complaint  Patient presents with   Foot Pain    Eft foot posterior heel pain x 2 months. 5 pain and tightness in back of heel. Wearing inserts in shoes. Tried heel cup and braces. Non diabetic. Having no improvements.    Discussed the use of AI scribe software for clinical note transcription with the patient, who gave verbal consent to proceed.  History of Present Illness Anna Pugh is a 53 year old female who presents with persistent left heel pain. She was referred by Dr. Magdalen for evaluation of persistent heel pain.  She experiences persistent pain in the back of her left heel for several months. Initially sharp and nerve-like, the pain is now a constant dull ache with occasional heat, worsening with walking. It feels like 'walking on something hard.'  She has a history of Achilles tendinitis, but the current pain is different. The pain disrupts her daily activities, including walking to the store and her active job with disabled adults. She is unable to take walks, which is unusual for her.  Previous treatments included x-rays and shoe inserts, which have not alleviated the pain. She received two injections; the first provided some relief, but the second did not help. She uses ibuprofen  intermittently.      Objective:    Physical Exam General: AAO x3, NAD  Dermatological: Skin is warm, dry and supple bilateral. There are no open sores, no preulcerative lesions, no rash or signs of infection present.  Vascular: Dorsalis Pedis artery and Posterior Tibial artery pedal pulses are 2/4 bilateral with immedate capillary fill time.  There is no pain with calf compression, swelling, warmth, erythema.   Neruologic: Grossly intact via light touch bilateral.   Musculoskeletal: Decreased medial arch arch.  There is tenderness to palpation on the posterior aspect of the calcaneus along insertion of the  Achilles tendon.  There is tenderness palpation along the plantar medial tubercle of the calcaneus at the insertion of the plantar fascia.  No pain in the arch of the foot.  There is no area pinpoint tenderness.  Flexor, extensor tendons intact.  MMT 5/5.  Gait: Unassisted, Nonantalgic.     No images are attached to the encounter.    Results RADIOLOGY Foot X-ray: Calcaneal spur on the plantar aspect of the heel   Assessment:   1. Achilles tendinitis, left leg   2. Plantar fasciitis, left      Plan:  Patient was evaluated and treated and all questions answered.  Assessment and Plan Assessment & Plan Left heel pain due to plantar fasciitis and heel spur Chronic left heel pain due to plantar fasciitis and heel spur. Pain is constant with intermittent exacerbations, worsens with activity. X-ray confirmed heel spur. - Instructed to ice heel for 20 minutes, twice daily. - Prescribed Medrol  Dosepak for inflammation. - Advised against ibuprofen  while on steroids; use ibuprofen  post-steroids as needed. - Provided worksheet with leg stretching and strengthening exercises. - Referred to physical therapy for management, including potential dry needling. - Provided night splint for rest periods to maintain foot position and allow icing. Static or dynamic ankle foot orthosis, including soft interface material, adjustable for fit, for positioning, may be used for minimal ambulation, prefabricated, off-the-shelf was dispensed on the billed date of service  - Encouraged consistent use of orthotic inserts and good arch support.  Follow-Up Follow-up in six weeks to evaluate treatment response and adjust  plan. - Scheduled follow-up appointment in six weeks.   Return in about 6 weeks (around 02/15/2024) for plantar fasciitis/tendonitis .   Donnice JONELLE Fees DPM

## 2024-01-04 NOTE — Patient Instructions (Addendum)
 For instructions on how to put on your Night Splint, please visit [email or web address]  --   Achilles Tendinitis  with Rehab Achilles tendinitis is a disorder of the Achilles tendon. The Achilles tendon connects the large calf muscles (Gastrocnemius and Soleus) to the heel bone (calcaneus). This tendon is sometimes called the heel cord. It is important for pushing-off and standing on your toes and is important for walking, running, or jumping. Tendinitis is often caused by overuse and repetitive microtrauma. SYMPTOMS Pain, tenderness, swelling, warmth, and redness may occur over the Achilles tendon even at rest. Pain with pushing off, or flexing or extending the ankle. Pain that is worsened after or during activity. CAUSES  Overuse sometimes seen with rapid increase in exercise programs or in sports requiring running and jumping. Poor physical conditioning (strength and flexibility or endurance). Running sports, especially training running down hills. Inadequate warm-up before practice or play or failure to stretch before participation. Injury to the tendon. PREVENTION  Warm up and stretch before practice or competition. Allow time for adequate rest and recovery between practices and competition. Keep up conditioning. Keep up ankle and leg flexibility. Improve or keep muscle strength and endurance. Improve cardiovascular fitness. Use proper technique. Use proper equipment (shoes, skates). To help prevent recurrence, taping, protective strapping, or an adhesive bandage may be recommended for several weeks after healing is complete. PROGNOSIS  Recovery may take weeks to several months to heal. Longer recovery is expected if symptoms have been prolonged. Recovery is usually quicker if the inflammation is due to a direct blow as compared with overuse or sudden strain. RELATED COMPLICATIONS  Healing time will be prolonged if the condition is not correctly treated. The injury must be  given plenty of time to heal. Symptoms can reoccur if activity is resumed too soon. Untreated, tendinitis may increase the risk of tendon rupture requiring additional time for recovery and possibly surgery. TREATMENT  The first treatment consists of rest anti-inflammatory medication, and ice to relieve the pain. Stretching and strengthening exercises after resolution of pain will likely help reduce the risk of recurrence. Referral to a physical therapist or athletic trainer for further evaluation and treatment may be helpful. A walking boot or cast may be recommended to rest the Achilles tendon. This can help break the cycle of inflammation and microtrauma. Arch supports (orthotics) may be prescribed or recommended by your caregiver as an adjunct to therapy and rest. Surgery to remove the inflamed tendon lining or degenerated tendon tissue is rarely necessary and has shown less than predictable results. MEDICATION  Nonsteroidal anti-inflammatory medications, such as aspirin and ibuprofen , may be used for pain and inflammation relief. Do not take within 7 days before surgery. Take these as directed by your caregiver. Contact your caregiver immediately if any bleeding, stomach upset, or signs of allergic reaction occur. Other minor pain relievers, such as acetaminophen, may also be used. Pain relievers may be prescribed as necessary by your caregiver. Do not take prescription pain medication for longer than 4 to 7 days. Use only as directed and only as much as you need. Cortisone injections are rarely indicated. Cortisone injections may weaken tendons and predispose to rupture. It is better to give the condition more time to heal than to use them. HEAT AND COLD Cold is used to relieve pain and reduce inflammation for acute and chronic Achilles tendinitis. Cold should be applied for 10 to 15 minutes every 2 to 3 hours for inflammation and pain and immediately after any activity that  aggravates your  symptoms. Use ice packs or an ice massage. Heat may be used before performing stretching and strengthening activities prescribed by your caregiver. Use a heat pack or a warm soak. SEEK MEDICAL CARE IF: Symptoms get worse or do not improve in 2 weeks despite treatment. New, unexplained symptoms develop. Drugs used in treatment may produce side effects.  EXERCISES:  RANGE OF MOTION (ROM) AND STRETCHING EXERCISES - Achilles Tendinitis  These exercises may help you when beginning to rehabilitate your injury. Your symptoms may resolve with or without further involvement from your physician, physical therapist or athletic trainer. While completing these exercises, remember:  Restoring tissue flexibility helps normal motion to return to the joints. This allows healthier, less painful movement and activity. An effective stretch should be held for at least 30 seconds. A stretch should never be painful. You should only feel a gentle lengthening or release in the stretched tissue.  STRETCH  Gastroc, Standing  Place hands on wall. Extend right / left leg, keeping the front knee somewhat bent. Slightly point your toes inward on your back foot. Keeping your right / left heel on the floor and your knee straight, shift your weight toward the wall, not allowing your back to arch. You should feel a gentle stretch in the right / left calf. Hold this position for 10 seconds. Repeat 3 times. Complete this stretch 2 times per day.  STRETCH  Soleus, Standing  Place hands on wall. Extend right / left leg, keeping the other knee somewhat bent. Slightly point your toes inward on your back foot. Keep your right / left heel on the floor, bend your back knee, and slightly shift your weight over the back leg so that you feel a gentle stretch deep in your back calf. Hold this position for 10 seconds. Repeat 3 times. Complete this stretch 2 times per day.  STRETCH  Gastrocsoleus, Standing  Note: This exercise can  place a lot of stress on your foot and ankle. Please complete this exercise only if specifically instructed by your caregiver.  Place the ball of your right / left foot on a step, keeping your other foot firmly on the same step. Hold on to the wall or a rail for balance. Slowly lift your other foot, allowing your body weight to press your heel down over the edge of the step. You should feel a stretch in your right / left calf. Hold this position for 10 seconds. Repeat this exercise with a slight bend in your knee. Repeat 3 times. Complete this stretch 2 times per day.   STRENGTHENING EXERCISES - Achilles Tendinitis These exercises may help you when beginning to rehabilitate your injury. They may resolve your symptoms with or without further involvement from your physician, physical therapist or athletic trainer. While completing these exercises, remember:  Muscles can gain both the endurance and the strength needed for everyday activities through controlled exercises. Complete these exercises as instructed by your physician, physical therapist or athletic trainer. Progress the resistance and repetitions only as guided. You may experience muscle soreness or fatigue, but the pain or discomfort you are trying to eliminate should never worsen during these exercises. If this pain does worsen, stop and make certain you are following the directions exactly. If the pain is still present after adjustments, discontinue the exercise until you can discuss the trouble with your clinician.  STRENGTH - Plantar-flexors  Sit with your right / left leg extended. Holding onto both ends of a rubber exercise  band/tubing, loop it around the ball of your foot. Keep a slight tension in the band. Slowly push your toes away from you, pointing them downward. Hold this position for 10 seconds. Return slowly, controlling the tension in the band/tubing. Repeat 3 times. Complete this exercise 2 times per day.   STRENGTH -  Plantar-flexors  Stand with your feet shoulder width apart. Steady yourself with a wall or table using as little support as needed. Keeping your weight evenly spread over the width of your feet, rise up on your toes.* Hold this position for 10 seconds. Repeat 3 times. Complete this exercise 2 times per day.  *If this is too easy, shift your weight toward your right / left leg until you feel challenged. Ultimately, you may be asked to do this exercise with your right / left foot only.  STRENGTH  Plantar-flexors, Eccentric  Note: This exercise can place a lot of stress on your foot and ankle. Please complete this exercise only if specifically instructed by your caregiver.  Place the balls of your feet on a step. With your hands, use only enough support from a wall or rail to keep your balance. Keep your knees straight and rise up on your toes. Slowly shift your weight entirely to your right / left toes and pick up your opposite foot. Gently and with controlled movement, lower your weight through your right / left foot so that your heel drops below the level of the step. You will feel a slight stretch in the back of your calf at the end position. Use the healthy leg to help rise up onto the balls of both feet, then lower weight only on the right / left leg again. Build up to 15 repetitions. Then progress to 3 consecutive sets of 15 repetitions.* After completing the above exercise, complete the same exercise with a slight knee bend (about 30 degrees). Again, build up to 15 repetitions. Then progress to 3 consecutive sets of 15 repetitions.* Perform this exercise 2 times per day.  *When you easily complete 3 sets of 15, your physician, physical therapist or athletic trainer may advise you to add resistance by wearing a backpack filled with additional weight.  STRENGTH - Plantar Flexors, Seated  Sit on a chair that allows your feet to rest flat on the ground. If necessary, sit at the edge of the  chair. Keeping your toes firmly on the ground, lift your right / left heel as far as you can without increasing any discomfort in your ankle. Repeat 3 times. Complete this exercise 2 times a day.  --  Ice Massage and Cold Packs Home Program  Ice and cold packs are used so that we can return the muscle to it's natural resting state without causing more pain, which can lead to more spasm, etc.  Icing and cold packs are also used to reduce swelling, which can lead to pain and stiffness.  COLD PACKS Cold packs should be placed circumferentially around the swollen area (ie., the wrist, etc.).  A paper towel can be placed over the area before the cold pack is applied.  A towel may be wrapped around the outside of the cold pack to keep the cold in.  The swollen area should be elevated with the cold pack, if possible.  ICE MASSAGE Fill a 4 ounce paper cup three-quarters full and put it in a freezer until it is frozen.  When ready to use, tear off about 1 inch of the cup so  that some of the ice is showing while the bottom of the cup can be used to hold onto. Massage the entire muscle area as instructed by your therapist.  You may use circular or up and down strokes, but do not hold the ice in one spot.  Four phases to the ice massage and cold pack application: Cold: which you feel when you first apply the ice. Ache: after a few minutes Burning: after approximately five minutes, it will feel like your skin is burning.  At this point, remove the ice for a minute or so. Numbness: THIS IS THE CRUCIAL PHASE!!! Return the ice or cold pack and massage until all the burning disappears.  This signals the end of cryotherapy.  The entire procedure should take ten to fifteen minutes while using the cold pack.  Do Not perform the ice massage for more than seven minutes on a small area or more than ten minutes on a large area.  Cryotherapy should be performed after exercises, when edema occurs, or when an area is  painful.

## 2024-01-15 ENCOUNTER — Ambulatory Visit (HOSPITAL_COMMUNITY)
Admission: EM | Admit: 2024-01-15 | Discharge: 2024-01-15 | Disposition: A | Discharge status: Home / Self Care | Attending: Emergency Medicine | Admitting: Emergency Medicine

## 2024-01-15 ENCOUNTER — Encounter (HOSPITAL_COMMUNITY): Payer: Self-pay | Admitting: *Deleted

## 2024-01-15 DIAGNOSIS — R5383 Other fatigue: Secondary | ICD-10-CM | POA: Diagnosis not present

## 2024-01-15 DIAGNOSIS — R432 Parageusia: Secondary | ICD-10-CM | POA: Diagnosis not present

## 2024-01-15 LAB — POC SARS CORONAVIRUS 2 AG -  ED: SARS Coronavirus 2 Ag: NEGATIVE

## 2024-01-15 NOTE — ED Provider Notes (Signed)
 [facility]    CSN: [id number] Arrival date & time: 01/15/24  0815      History   Chief Complaint Chief Complaint  Patient presents with   Fatigue    HPI Anna Pugh is a 53 y.o. female.  Yesterday fatigue and body aches Today lost taste, and mild headache Decreased appetite  Took tylenol that resolved headache  No fever, congestion, sore throat, cough, abd pain, NVD Sick contacts at work No recent travel  Past Medical History:  Diagnosis Date   GERD (gastroesophageal reflux disease)    Localization-related (focal) (partial) epilepsy and epileptic syndromes with complex partial seizures, without mention of intractable epilepsy 01/04/2013   Memory deficit    Migraine without aura, without mention of intractable migraine without mention of status migrainosus 01/04/2013   Obesity    Seizures (HCC)     Patient Active Problem List   Diagnosis Date Noted   Fatigue 10/09/2018   Advice given about COVID-19 virus by telephone 10/09/2018   Partial epilepsy with impairment of consciousness (HCC) 01/04/2013   Migraine without aura 01/04/2013    Past Surgical History:  Procedure Laterality Date   TONSILLECTOMY      OB History     Gravida  0   Para  0   Term  0   Preterm  0   AB  0   Living  0      SAB  0   IAB  0   Ectopic  0   Multiple  0   Live Births  0            Home Medications    Prior to Admission medications   Medication Sig Start Date End Date Taking? Authorizing Provider  atorvastatin  (LIPITOR) 20 MG tablet Take 1 tablet (20 mg total) by mouth daily. 11/20/23  Yes Lorren, Alannah J, NP  carbamazepine  ([name] ) 200 MG 12 hr capsule Take 1 capsule (200mg ) in the am and 3 capsules (600mg ) in the evening 10/18/23  Yes Lomax, Christyann, NP  cetirizine (ZYRTEC) 10 MG tablet Take 10 mg by mouth daily.   Yes [provider]  cholecalciferol (VITAMIN D) 1000 units tablet Take 1,000 Units by mouth daily.   Yes [provider]  lacosamide  (VIMPAT ) 50 MG TABS tablet Take 1 tablet (50 mg total) by mouth 2 (two) times daily. 08/07/23  Yes Lomax, Larosa, NP  Multiple Vitamin (MULTIVITAMIN PO) Take 1 tablet by mouth daily.   Yes [provider]  b complex vitamins capsule Take 1 capsule by mouth daily.    [provider]  methylPREDNISolone  (MEDROL  DOSEPAK) 4 MG TBPK tablet Take as directed 01/04/24   Gershon Donnice SAUNDERS, DPM  phentermine  15 MG capsule Take 1 capsule (15 mg total) by mouth every morning. 10/25/23   Lorren Greig PARAS, NP  rizatriptan  (MAXALT ) 10 MG tablet Take 1 tablet (10 mg total) by mouth daily as needed for migraine. May repeat in 2 hours if needed 08/07/23   Lomax, Season, NP  triamcinolone  cream (KENALOG ) 0.1 % Apply 1 Application topically 2 (two) times daily. 01/25/23   Loreda Myla SAUNDERS, NP    Family History Family History  Problem Relation Age of Onset   Stroke Mother    Hypertension Mother    Migraines Father    Myasthenia gravis Father    Cancer Brother     Social History Social History   Tobacco Use   Smoking status: Never   Smokeless tobacco: Never  Vaping Use   Vaping status: Never Used  Substance Use Topics   Alcohol use: No   Drug use: No     Allergies   Keppra [levetiracetam], Lamotrigine, Topamax [topiramate], and Azithromycin   Review of Systems Review of Systems  As per HPI  Physical Exam Triage Vital Signs ED Triage Vitals  Encounter Vitals Group     BP 01/15/24 0855 117/84     Girls Systolic BP Percentile --      Girls Diastolic BP Percentile --      Boys Systolic BP Percentile --      Boys Diastolic BP Percentile --      Pulse Rate 01/15/24 0855 79     Resp 01/15/24 0855 18     Temp 01/15/24 0855 98.3 F (36.8 C)     Temp Source 01/15/24 0855 Oral     SpO2 01/15/24 0855 96 %     Weight --      Height --      Head Circumference --      Peak Flow --      Pain Score 01/15/24 0853 0     Pain Loc --      Pain Education --      Exclude from  Growth Chart --    No data found.  Updated Vital Signs BP 117/84 (BP Location: Right Arm)   Pulse 79   Temp 98.3 F (36.8 C) (Oral)   Resp 18   SpO2 96%    Physical Exam Vitals and nursing note reviewed.  Constitutional:      Appearance: She is not ill-appearing.  HENT:     Right Ear: Tympanic membrane and ear canal normal.     Left Ear: Tympanic membrane and ear canal normal.     Nose: No congestion or rhinorrhea.     Mouth/Throat:     Mouth: Mucous membranes are moist.     Pharynx: Oropharynx is clear. No posterior oropharyngeal erythema.  Eyes:     Conjunctiva/sclera: Conjunctivae normal.  Cardiovascular:     Rate and Rhythm: Normal rate and regular rhythm.     Pulses: Normal pulses.     Heart sounds: Normal heart sounds.  Pulmonary:     Effort: Pulmonary effort is normal.     Breath sounds: Normal breath sounds.  Abdominal:     Palpations: Abdomen is soft.     Tenderness: There is no abdominal tenderness.  Musculoskeletal:     Cervical back: Normal range of motion.  Lymphadenopathy:     Cervical: No cervical adenopathy.  Skin:    General: Skin is warm and dry.  Neurological:     Mental Status: She is alert and oriented to person, place, and time.      UC Treatments / Results  Labs (all labs ordered are listed, but only abnormal results are displayed) Labs Reviewed  POC SARS CORONAVIRUS 2 AG -  ED    EKG   Radiology No results found.  Procedures Procedures (including critical care time)  Medications Ordered in UC Medications - No data to display  Initial Impression / Assessment and Plan / UC Course  I have reviewed the triage vital signs and the nursing notes.  Pertinent labs & imaging results that were available during my care of the patient were reviewed by me and considered in my medical decision making (see chart for details).  Afebrile, stable vitals. Well appearing Rapid covid test negative. Could be false negative, as symptoms only  began yesterday. Otherwise start of other viral illness. Discussion with patient. Supportive care discussed. Note for work is provided  Final Clinical Impressions(s) / UC Diagnoses   Final diagnoses:  Fatigue, unspecified type  Loss of taste     Discharge Instructions      Your covid test was negative. It could be too soon to detect a positive result. Otherwise, you may have some other viral illness Continue supportive care Drink lots of fluids to stay hydrated Keep washing hands and disinfecting commonly touched surfaces  Happy early birthday!     ED Prescriptions   None    PDMP not reviewed this encounter.   Jeryl Stabs, PA-C 01/15/24 0941

## 2024-01-15 NOTE — ED Triage Notes (Signed)
 Pt states she has had decreased appetite and no taste X 2 days. She also complains yesterday she had fatigue and body aches but they have resolved. She is taking no meds for sx.

## 2024-01-15 NOTE — Discharge Instructions (Addendum)
 Your covid test was negative. It could be too soon to detect a positive result. Otherwise, you may have some other viral illness Continue supportive care Drink lots of fluids to stay hydrated Keep washing hands and disinfecting commonly touched surfaces  Happy early birthday!

## 2024-01-22 NOTE — Therapy (Incomplete)
 OUTPATIENT PHYSICAL THERAPY LOWER EXTREMITY EVALUATION   Patient Name: Anna Pugh MRN: [id number] DOB:Aug 28, 1970, 53 y.o., female Today's Date: 01/22/2024  END OF SESSION:   Past Medical History:  Diagnosis Date   GERD (gastroesophageal reflux disease)    Localization-related (focal) (partial) epilepsy and epileptic syndromes with complex partial seizures, without mention of intractable epilepsy 01/04/2013   Memory deficit    Migraine without aura, without mention of intractable migraine without mention of status migrainosus 01/04/2013   Obesity    Seizures (HCC)    Past Surgical History:  Procedure Laterality Date   TONSILLECTOMY     Patient Active Problem List   Diagnosis Date Noted   Fatigue 10/09/2018   Advice given about COVID-19 virus by telephone 10/09/2018   Partial epilepsy with impairment of consciousness (HCC) 01/04/2013   Migraine without aura 01/04/2013    PCP: Lorren Greig PARAS, NP  REFERRING PROVIDER: Gershon Donnice SAUNDERS, DPM   REFERRING DIAG:  [phone] (ICD-10-CM) - Achilles tendinitis, left leg  M72.2 (ICD-10-CM) - Plantar fasciitis, left    THERAPY DIAG:  No diagnosis found.  Rationale for Evaluation and Treatment: Rehabilitation  ONSET DATE: ***  SUBJECTIVE:   SUBJECTIVE STATEMENT: ***  PERTINENT HISTORY: *** PAIN:  Are you having pain? Yes: NPRS scale: *** Pain location: *** Pain description: *** Aggravating factors: *** Relieving factors: ***  PRECAUTIONS: {Therapy precautions:24002}  RED FLAGS: {PT Red Flags:29287}   WEIGHT BEARING RESTRICTIONS: {Yes ***/No:24003}  FALLS:  Has patient fallen in last 6 months? {fallsyesno:27318}  LIVING ENVIRONMENT: Lives with: {[facility] lives with:25569::lives with their family} Lives in: {Lives in:25570} Stairs: {opstairs:27293} Has following equipment at home: {Assistive devices:23999}  OCCUPATION: ***  PLOF: {PLOF:24004}  PATIENT GOALS: ***  NEXT MD VISIT: ***  OBJECTIVE:  Note:  Objective measures were completed at Evaluation unless otherwise noted.  DIAGNOSTIC FINDINGS: RADIOLOGY Foot X-ray: Calcaneal spur on the plantar aspect of the heel  PATIENT SURVEYS:  LEFS:   COGNITION: Overall cognitive status: {cognition:24006}     SENSATION: {sensation:27233}  EDEMA:  {edema:24020}  MUSCLE LENGTH: Hamstrings: Right *** deg; Left *** deg Debby test: Right *** deg; Left *** deg  POSTURE: {posture:25561}  PALPATION: ***  LOWER EXTREMITY ROM:  {AROM/PROM:27142} ROM Right eval Left eval  Hip flexion    Hip extension    Hip abduction    Hip adduction    Hip internal rotation    Hip external rotation    Knee flexion    Knee extension    Ankle dorsiflexion    Ankle plantarflexion    Ankle inversion    Ankle eversion     (Blank rows = not tested)  LOWER EXTREMITY MMT:  MMT Right eval Left eval  Hip flexion    Hip extension    Hip abduction    Hip adduction    Hip internal rotation    Hip external rotation    Knee flexion    Knee extension    Ankle dorsiflexion    Ankle plantarflexion    Ankle inversion    Ankle eversion     (Blank rows = not tested)  LOWER EXTREMITY SPECIAL TESTS:  {LEspecialtests:26242}  FUNCTIONAL TESTS:  {Functional tests:24029}  GAIT: Distance walked: *** Assistive device utilized: {Assistive devices:23999} Level of assistance: {Levels of assistance:24026} Comments: ***  TREATMENT DATE:   [facility] Adult PT Treatment:                                                DATE: 01/23/24 Therapeutic Exercise: *** Manual Therapy: *** Neuromuscular re-ed: *** Therapeutic Activity: *** Modalities: *** Self Care: ***     PATIENT EDUCATION:  Education details: *** Person educated: {Person educated:25204} Education method: {Education Method:25205} Education comprehension: {Education  Comprehension:25206}  HOME EXERCISE PROGRAM: ***  ASSESSMENT:  CLINICAL IMPRESSION: Patient is a *** y.o. *** who was seen today for physical therapy evaluation and treatment for  M76.62 (ICD-10-CM) - Achilles tendinitis, left leg  M72.2 (ICD-10-CM) - Plantar fasciitis, left  .   OBJECTIVE IMPAIRMENTS: {opptimpairments:25111}.   ACTIVITY LIMITATIONS: {activitylimitations:27494}  PARTICIPATION LIMITATIONS: {participationrestrictions:25113}  PERSONAL FACTORS: {Personal factors:25162} are also affecting patient's functional outcome.   REHAB POTENTIAL: {rehabpotential:25112}  CLINICAL DECISION MAKING: {clinical decision making:25114}  EVALUATION COMPLEXITY: {Evaluation complexity:25115}   GOALS: Goals reviewed with patient? {yes/no:20286}  SHORT TERM GOALS: Target date: *** *** Baseline: Goal status: INITIAL  2.  *** Baseline:  Goal status: INITIAL  3.  *** Baseline:  Goal status: INITIAL  4.  *** Baseline:  Goal status: INITIAL  5.  *** Baseline:  Goal status: INITIAL  6.  *** Baseline:  Goal status: INITIAL  LONG TERM GOALS: Target date: ***  *** Baseline:  Goal status: INITIAL  2.  *** Baseline:  Goal status: INITIAL  3.  *** Baseline:  Goal status: INITIAL  4.  *** Baseline:  Goal status: INITIAL  5.  *** Baseline:  Goal status: INITIAL  6.  *** Baseline:  Goal status: INITIAL   PLAN:  PT FREQUENCY: {rehab frequency:25116}  PT DURATION: {rehab duration:25117}  PLANNED INTERVENTIONS: {rehab planned interventions:25118::97110-Therapeutic exercises,97530- Therapeutic [phone]- Neuromuscular re-education,97535- Self Rjmz,02859- Manual therapy}  PLAN FOR NEXT SESSION: ***   Jaydin Boniface, PT 01/22/2024, 8:20 PM

## 2024-01-23 ENCOUNTER — Ambulatory Visit

## 2024-02-01 ENCOUNTER — Ambulatory Visit: Attending: Podiatry

## 2024-02-01 ENCOUNTER — Other Ambulatory Visit: Payer: Self-pay

## 2024-02-01 DIAGNOSIS — M722 Plantar fascial fibromatosis: Secondary | ICD-10-CM | POA: Diagnosis not present

## 2024-02-01 DIAGNOSIS — M79672 Pain in left foot: Secondary | ICD-10-CM | POA: Diagnosis present

## 2024-02-01 DIAGNOSIS — M7662 Achilles tendinitis, left leg: Secondary | ICD-10-CM | POA: Insufficient documentation

## 2024-02-01 DIAGNOSIS — R262 Difficulty in walking, not elsewhere classified: Secondary | ICD-10-CM | POA: Insufficient documentation

## 2024-02-01 NOTE — Therapy (Signed)
 OUTPATIENT PHYSICAL THERAPY LOWER EXTREMITY EVALUATION      Patient Name: Anna Pugh MRN: [id number] DOB:1971/05/12, 53 y.o., female Today's Date: 02/01/2024  END OF SESSION:   Past Medical History:  Diagnosis Date   GERD (gastroesophageal reflux disease)    Localization-related (focal) (partial) epilepsy and epileptic syndromes with complex partial seizures, without mention of intractable epilepsy 01/04/2013   Memory deficit    Migraine without aura, without mention of intractable migraine without mention of status migrainosus 01/04/2013   Obesity    Seizures (HCC)    Past Surgical History:  Procedure Laterality Date   TONSILLECTOMY     Patient Active Problem List   Diagnosis Date Noted   Fatigue 10/09/2018   Advice given about COVID-19 virus by telephone 10/09/2018   Partial epilepsy with impairment of consciousness (HCC) 01/04/2013   Migraine without aura 01/04/2013    PCP: Lorren Greig PARAS, NP  REFERRING PROVIDER: Gershon Donnice SAUNDERS, DPM  REFERRING DIAG: Achilles tendinitis, left leg [M76.62], Plantar fasciitis, left [M72.2]   THERAPY DIAG:  Pain in left foot  Difficulty in walking, not elsewhere classified  Rationale for Evaluation and Treatment: Rehabilitation  ONSET DATE: 01/04/2024 date of referral  SUBJECTIVE:   SUBJECTIVE STATEMENT: Patient reports that her plantar fasciitis pain has been present for most of this year. She states that she initially responded well to injections for her pain, and its not longer responding to that. She has also tried the recommended inserts to her shoes. She reports that this pain is affective everything, including her weight gain, d/t decreased activity tolerance. She is frustrated with not being able to go the gym.  PERTINENT HISTORY: Relevant PMHx includes obesity, seizures/partial epilepsy, migraines  PAIN:  Are you having pain?  Yes: NPRS scale: 3/10 current, 10/10 worst  Pain location: L heel pain Aggravating  factors: prolonged standing/walking,  Relieving factors: slippers that are ice packs, use of brace at night   PRECAUTIONS: None  RED FLAGS: None   WEIGHT BEARING RESTRICTIONS: No  FALLS:  Has patient fallen in last 6 months? 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  DIAGNOSTIC FINDINGS:   Per referring provider: 01/04/2024  RADIOLOGY Foot X-ray: Calcaneal spur on the plantar aspect of the heel  PATIENT SURVEYS:  LEFS   Extreme difficulty/unable (0), Quite a bit of difficulty (1), Moderate difficulty (2), Little difficulty (3), No difficulty (4) Survey date:  02/01/2024  Any of your usual work, housework or school activities 3  2. Usual hobbies, recreational or sporting activities 1  3. Getting into/out of the bath 4  4. Walking between rooms 4  5. Putting on socks/shoes 4  6. Squatting  4  7. Lifting an object, like a bag of groceries from the floor 4  8. Performing light activities around your home 4  9. Performing heavy activities around your home 3  10. Getting into/out of a car 4  11. Walking 2 blocks 1  12. Walking 1 mile 0  13. Going up/down 10 stairs (1 flight) 2  14. Standing for 1 hour 2  15.  sitting for 1 hour 4  16. Running on even ground 0  17. Running on uneven ground 0  18. Making sharp turns while running fast 0  19. Hopping  0  20. Rolling over in bed 4  Score total:  48/80     COGNITION: Overall cognitive status: Within functional limits for tasks assessed     PALPATION: No tenderness to palpation along planter surface of foot, achilles tendon, nor gastroc/soleus mm belly  Mild  tenderness to palpation along plantar surface of calcaneus   LOWER EXTREMITY ROM:  Active ROM Right eval Left eval  Hip flexion    Hip extension    Hip abduction    Hip adduction    Hip internal rotation    Hip external rotation    Knee flexion    Knee extension    Ankle dorsiflexion  10  Ankle plantarflexion  45  Ankle inversion  35  Ankle eversion  8   (Blank rows = not tested)  LOWER EXTREMITY MMT:  MMT Right eval Left eval  Hip flexion    Hip extension    Hip abduction    Hip adduction    Hip internal rotation    Hip external rotation    Knee flexion    Knee extension    Ankle dorsiflexion    Ankle plantarflexion    Ankle inversion    Ankle eversion     (Blank rows = not tested)  LOWER EXTREMITY SPECIAL TESTS:  deferred  FUNCTIONAL TESTS:  deferred  GAIT: Distance walked: 100 ft within clinic Assistive device utilized: None Level of assistance: Complete Independence Comments: mild antalgic gait pattern after sitting, with decreased L stance time, decreased ankle DF at initial contact                                                                                                                                TREATMENT DATE:   [facility] Adult PT Treatment:                                                DATE: 02/01/2024  Initial evaluation: see patient education and home exercise program as noted below     PATIENT EDUCATION:  Education details: reviewed initial home exercise program; discussion of POC, prognosis and goals for skilled PT   Person educated: Patient Education method: Explanation, Demonstration, and Handouts Education comprehension: verbalized understanding, returned demonstration, and needs further education  HOME EXERCISE PROGRAM: Access Code: [email or web address] URL: [email or web address] Date: 02/01/2024 Prepared by: Marko Molt  Exercises - Soleus Stretch on Wall  - 2 x daily - 7 x weekly - 2 sets - 20 sec hold - Long  Sitting Calf Stretch with Strap  - 2 x daily - 7 x weekly - 2 sets - 20 sec hold - Seated Ankle Alphabet  - 1 x daily - 7 x weekly -  1 sets - Seated Ankle Pumps on Table  - 1 x daily - 7 x weekly - 10-15 reps - Seated Ankle Dorsiflexion with Resistance  - 1 x daily - 3 x weekly - 2 sets - 10 reps - Seated Ankle Eversion with Resistance  - 1 x daily - 3 x weekly - 2 sets - 10 reps - Seated Ankle Plantar Flexion with Resistance Loop  - 1 x daily - 3 x weekly - 2 sets - 10 reps - Seated Ankle Inversion with Resistance  - 1 x daily - 3 x weekly - 2 sets - 10 reps  Patient Education - Plantar Fasciitis - Achilles Tendinopathy  ASSESSMENT:  CLINICAL IMPRESSION: Anna Pugh is a 53 y.o. female who was seen today for physical therapy evaluation and treatment for persistent L heel pain with altered gait mechanics. She has normal ankle AROM and point tenderness at plantar surface of heel. She has related pain and difficulty with long periods of standing, and walking. She requires skilled PT services at this time to address relevant deficits and improve overall function.     OBJECTIVE IMPAIRMENTS: Abnormal gait, decreased activity tolerance, decreased endurance, difficulty walking, and pain.   ACTIVITY LIMITATIONS: standing, squatting, stairs, and locomotion level  PARTICIPATION LIMITATIONS: meal prep, cleaning, laundry, interpersonal relationship, shopping, community activity, and occupation  PERSONAL FACTORS: Time since onset of injury/illness/exacerbation and 3+ comorbidities: Relevant PMHx includes obesity, seizures/partial epilepsy, migraines are also affecting patient's functional outcome.   REHAB POTENTIAL: Fair    CLINICAL DECISION MAKING: Stable/uncomplicated  EVALUATION COMPLEXITY: Low   GOALS: Goals reviewed with patient? YES  SHORT TERM GOALS = LONG TERM GOALS: Target date: 02/29/2024   Patient will be independent with initial home program at least 3 days/week.  Baseline: provided at  eval Goal Status: INITIAL   Patient will report improved overall functional ability with LEFS score of 58/80 or greater.  Baseline: 48/80 Goal Status: INITIAL   3.  Patient will demonstrate ability to tolerate at least 20 minutes of weight bearing activity, with no more than minimal pain in order to demonstrate ability to return to walking for exercise and occupational duties.  Baseline: moderate-to-severe pain with standing and walking, unable to walk on treadmill for normal exercises  Goal status: INITIAL      PLAN:  PT FREQUENCY: 1x/week  PT DURATION: 4 weeks  PLANNED INTERVENTIONS: 02835- PT Re-evaluation, 97750- Physical Performance Testing, 97110-Therapeutic exercises, 97530- Therapeutic activity, 97112- Neuromuscular re-education, 97535- Self Care, 02859- Manual therapy, [phone]- Aquatic Therapy, [phone] (1-2 muscles), 20561 (3+ muscles)- Dry Needling, Patient/Family education, Taping, Joint mobilization, Cryotherapy, and Moist heat  PLAN FOR NEXT SESSION: address foot intrinsic mm, low impact aerobic activity for pain modulation effects, manual therapy and modalities as indicated, assess midfoot joint mobility, updated HEP   Marko Molt, PT, DPT  02/04/2024 10:35 AM

## 2024-02-13 ENCOUNTER — Telehealth: Payer: Self-pay

## 2024-02-13 NOTE — Telephone Encounter (Signed)
 LVM regarding need to cancel appointments on 10/1 and 10/15. Left call back number to reschedule.   Corean Pouch, PTA 02/13/24 3:15 PM

## 2024-02-14 ENCOUNTER — Ambulatory Visit

## 2024-02-21 ENCOUNTER — Other Ambulatory Visit: Payer: Self-pay | Admitting: Family

## 2024-02-21 ENCOUNTER — Ambulatory Visit: Attending: Podiatry

## 2024-02-21 DIAGNOSIS — R262 Difficulty in walking, not elsewhere classified: Secondary | ICD-10-CM | POA: Insufficient documentation

## 2024-02-21 DIAGNOSIS — M79672 Pain in left foot: Secondary | ICD-10-CM | POA: Diagnosis present

## 2024-02-21 DIAGNOSIS — E785 Hyperlipidemia, unspecified: Secondary | ICD-10-CM

## 2024-02-21 NOTE — Therapy (Signed)
 OUTPATIENT PHYSICAL THERAPY NOTE      Patient Name: Anna Pugh MRN: [id number] DOB:1971-01-29, 53 y.o., female Today's Date: 02/21/2024  END OF SESSION:   PT End of Session - 02/21/24 1707     Visit Number 2    Number of Visits 5    Date for Recertification  02/29/24    Authorization Type [employer]    PT Start Time 1703    PT Stop Time 1742    PT Time Calculation (min) 39 min           Past Medical History:  Diagnosis Date   GERD (gastroesophageal reflux disease)    Localization-related (focal) (partial) epilepsy and epileptic syndromes with complex partial seizures, without mention of intractable epilepsy 01/04/2013   Memory deficit    Migraine without aura, without mention of intractable migraine without mention of status migrainosus 01/04/2013   Obesity    Seizures (HCC)    Past Surgical History:  Procedure Laterality Date   TONSILLECTOMY     Patient Active Problem List   Diagnosis Date Noted   Fatigue 10/09/2018   Advice given about COVID-19 virus by telephone 10/09/2018   Partial epilepsy with impairment of consciousness (HCC) 01/04/2013   Migraine without aura 01/04/2013    PCP: Lorren Greig PARAS, NP  REFERRING PROVIDER: Gershon Donnice SAUNDERS, DPM  REFERRING DIAG: Achilles tendinitis, left leg [M76.62], Plantar fasciitis, left [M72.2]   THERAPY DIAG:  Pain in left foot  Difficulty in walking, not elsewhere classified  Rationale for Evaluation and Treatment: Rehabilitation  ONSET DATE: 01/04/2024 date of referral  SUBJECTIVE:   SUBJECTIVE STATEMENT: 02/22/2024 Patient reports that she has been able to walk and participate in most of her normal fitness activities. However, she continues to have L achilles pain with bike and occasionally throughout the day.   EVAL:  Patient reports that her plantar fasciitis pain has been present for most of this year. She states that she initially responded well to injections for her pain, and its not longer responding to  that. She has also tried the recommended inserts to her shoes. She reports that this pain is affective everything, including her weight gain, d/t decreased activity tolerance. She is frustrated with not being able to go the gym.  PERTINENT HISTORY: Relevant PMHx includes obesity, seizures/partial epilepsy, migraines  PAIN:  Are you having pain?  Yes: NPRS scale: 3/10 current, 10/10 worst  Pain location: L heel pain Aggravating factors: prolonged standing/walking,  Relieving factors: slippers that are ice packs, use of brace at night   PRECAUTIONS: None  RED FLAGS: None   WEIGHT BEARING RESTRICTIONS: No  FALLS:  Has patient fallen in last 6 months? 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  DIAGNOSTIC FINDINGS:   Per referring provider: 01/04/2024  RADIOLOGY Foot X-ray: Calcaneal spur on the plantar aspect of the heel  PATIENT SURVEYS:  LEFS   Extreme difficulty/unable (0), Quite a bit of difficulty (1), Moderate difficulty (2), Little difficulty (3), No difficulty (4) Survey date:  02/01/2024  Any of your usual work, housework or school activities 3  2. Usual hobbies, recreational or sporting activities 1  3. Getting into/out of  the bath 4  4. Walking between rooms 4  5. Putting on socks/shoes 4  6. Squatting  4  7. Lifting an object, like a bag of groceries from the floor 4  8. Performing light activities around your home 4  9. Performing heavy activities around your home 3  10. Getting into/out of a car 4  11. Walking 2 blocks 1  12. Walking 1 mile 0  13. Going up/down 10 stairs (1 flight) 2  14. Standing  for 1 hour 2  15.  sitting for 1 hour 4  16. Running on even ground 0  17. Running on uneven ground 0  18. Making sharp turns while running fast 0  19. Hopping  0  20. Rolling over in bed 4  Score total:  48/80     COGNITION: Overall cognitive status: Within functional limits for tasks assessed     PALPATION: No tenderness to palpation along planter surface of foot, achilles tendon, nor gastroc/soleus mm belly  Mild tenderness to palpation along plantar surface of calcaneus   LOWER EXTREMITY ROM:  Active ROM Right eval Left eval  Hip flexion    Hip extension    Hip abduction    Hip adduction    Hip internal rotation    Hip external rotation    Knee flexion    Knee extension    Ankle dorsiflexion  10  Ankle plantarflexion  45  Ankle inversion  35  Ankle eversion  8   (Blank rows = not tested)  LOWER EXTREMITY MMT:  MMT Right eval Left eval  Hip flexion    Hip extension    Hip abduction    Hip adduction    Hip internal rotation    Hip external rotation    Knee flexion    Knee extension    Ankle dorsiflexion    Ankle plantarflexion    Ankle inversion    Ankle eversion     (Blank rows = not tested)  LOWER EXTREMITY SPECIAL TESTS:  deferred  FUNCTIONAL TESTS:  deferred  GAIT: Distance walked: 100 ft within clinic Assistive device utilized: None Level of assistance: Complete Independence Comments: mild antalgic gait pattern after sitting, with decreased L stance time, decreased ankle DF at initial contact                                                                                                                                TREATMENT DATE:    [facility] Adult PT Treatment:                                                DATE: 02/22/2024  Therapeutic Exercise: NuStep lvl 4 x 6 minutes  Ankle Pumps x 20  Gastroc stretch with stretch strap 3 x 30 sec   Manual  Therapy: Grade 2 mid foot mobilization   Neuromuscular re-ed: Toe scrunches with towel   Toe yoga - alternating toe extension  Therapeutic Activity:  Patient education and collaborative planning for progression of weight bearing activities at gym in order to reach patient's goal of being about to walk 1 mile to the store and 1 mile back to her home without pain .   [facility] Adult PT Treatment:                                                DATE: 02/01/2024  Initial evaluation: see patient education and home exercise program as noted below     PATIENT EDUCATION:  Education details: reviewed initial home exercise program; discussion of POC, prognosis and goals for skilled PT   Person educated: Patient Education method: Explanation, Demonstration, and Handouts Education comprehension: verbalized understanding, returned demonstration, and needs further education  HOME EXERCISE PROGRAM: Access Code: [email or web address] URL: [email or web address] Date: 02/01/2024 Prepared by: Marko Molt  Exercises - Soleus Stretch on Wall  - 2 x daily - 7 x weekly - 2 sets - 20 sec hold - Long Sitting Calf Stretch with Strap  - 2 x daily - 7 x weekly - 2 sets - 20 sec hold - Seated Ankle Alphabet  - 1 x daily - 7 x weekly - 1 sets - Seated Ankle Pumps on Table  - 1 x daily - 7 x weekly - 10-15 reps - Seated Ankle Dorsiflexion with Resistance  - 1 x daily - 3 x weekly - 2 sets - 10 reps - Seated Ankle Eversion with Resistance  - 1 x daily - 3 x weekly - 2 sets - 10 reps - Seated Ankle Plantar Flexion with Resistance Loop  - 1 x daily - 3 x weekly - 2 sets - 10 reps - Seated Ankle Inversion with Resistance  - 1 x daily - 3 x weekly - 2 sets - 10 reps  Patient Education - Plantar Fasciitis - Achilles Tendinopathy  ASSESSMENT:  CLINICAL IMPRESSION:  02/21/2024 Anna Pugh had good tolerance of initial treatment session. Tactile and Verbal cues required for instruction, especially with toe intrinsic mm activities. Patient requires ongoing skilled PT intervention to address current impairments  and related functional deficits. We will continue to progress as tolerated.    EVAL: Anna Pugh is a 53 y.o. female who was seen today for physical therapy evaluation and treatment for persistent L heel pain with altered gait mechanics. She has normal ankle AROM and point tenderness at plantar surface of heel. She has related pain and difficulty with long periods of standing, and walking. She requires skilled PT services at this time to address relevant deficits and improve overall function.     OBJECTIVE IMPAIRMENTS: Abnormal gait, decreased activity tolerance, decreased endurance, difficulty walking, and pain.   ACTIVITY LIMITATIONS: standing, squatting, stairs, and locomotion level  PARTICIPATION LIMITATIONS: meal prep, cleaning, laundry, interpersonal relationship, shopping, community activity, and occupation  PERSONAL FACTORS: Time since onset of injury/illness/exacerbation and 3+ comorbidities: Relevant PMHx includes obesity, seizures/partial epilepsy, migraines are also affecting patient's functional outcome.   REHAB POTENTIAL: Fair    CLINICAL DECISION MAKING: Stable/uncomplicated  EVALUATION COMPLEXITY: Low   GOALS: Goals reviewed with patient? YES  SHORT TERM GOALS = LONG TERM GOALS: Target date: 02/29/2024   Patient will be independent with initial home program at least  3 days/week.  Baseline: provided at eval Goal Status: INITIAL   Patient will report improved overall functional ability with LEFS score of 58/80 or greater.  Baseline: 48/80 Goal Status: INITIAL   3.  Patient will demonstrate ability to tolerate at least 20 minutes of weight bearing activity, with no more than minimal pain in order to demonstrate ability to return to walking for exercise and occupational duties.  Baseline: moderate-to-severe pain with standing and walking, unable to walk on treadmill for normal exercises  Goal status: INITIAL      PLAN:  PT FREQUENCY: 1x/week  PT DURATION: 4  weeks  PLANNED INTERVENTIONS: 02835- PT Re-evaluation, 97750- Physical Performance Testing, 97110-Therapeutic exercises, 97530- Therapeutic activity, 97112- Neuromuscular re-education, 97535- Self Care, 02859- Manual therapy, [phone]- Aquatic Therapy, [phone] (1-2 muscles), [id number] (3+ muscles)- Dry Needling, Patient/Family education, Taping, Joint mobilization, Cryotherapy, and Moist heat  PLAN FOR NEXT SESSION: address foot intrinsic mm, low impact aerobic activity for pain modulation effects, manual therapy and modalities as indicated, assess midfoot joint mobility, updated HEP   Marko Molt, PT, DPT  02/22/2024 7:13 AM

## 2024-02-22 ENCOUNTER — Encounter: Payer: Self-pay | Admitting: Podiatry

## 2024-02-22 ENCOUNTER — Ambulatory Visit: Admitting: Podiatry

## 2024-02-22 VITALS — Ht 62.0 in | Wt 220.0 lb

## 2024-02-22 DIAGNOSIS — M722 Plantar fascial fibromatosis: Secondary | ICD-10-CM | POA: Diagnosis not present

## 2024-02-22 DIAGNOSIS — M7662 Achilles tendinitis, left leg: Secondary | ICD-10-CM

## 2024-02-22 MED ORDER — METHYLPREDNISOLONE 4 MG PO TBPK: ORAL_TABLET | ORAL | 0 refills | Status: DC

## 2024-02-22 NOTE — Patient Instructions (Signed)

## 2024-02-24 NOTE — Progress Notes (Signed)
  Subjective:  Patient ID: Anna Pugh, female    DOB: 1970-07-10,  MRN: [id number] Chief Complaint  Patient presents with   Plantar Fasciitis    Pt is here to f/u on left foot ankle due to plantar fasciitis, states everything is going well, she is doing physical therapy which is helping a lot.     History of Present Illness Anna Pugh is a 53 year old female who presents with persistent left heel pain.  She says overall she is doing much better.  She has been doing physical therapy which is helping a lot as well.  She does not recall any recent injuries or changes otherwise and she has no new concerns today.      Objective:    Physical Exam General: AAO x3, NAD  Dermatological: Skin is warm, dry and supple bilateral. There are no open sores, no preulcerative lesions, no rash or signs of infection present.  Vascular: Dorsalis Pedis artery and Posterior Tibial artery pedal pulses are 2/4 bilateral with immedate capillary fill time.  There is no pain with calf compression, swelling, warmth, erythema.   Neruologic: Grossly intact via light touch bilateral.   Musculoskeletal: Decreased medial arch arch.  There is mild tenderness to palpation on the posterior aspect of the calcaneus along insertion of the Achilles tendon.  There is slight discomfort to palpation along the plantar medial tubercle of the calcaneus at the insertion of the plantar fascia.  No pain in the arch of the foot.  There is no area pinpoint tenderness.  Flexor, extensor tendons intact.  MMT 5/5.  Gait: Unassisted, Nonantalgic.     Assessment:   1. Achilles tendinitis, left leg   2. Plantar fasciitis, left      Plan:  Patient was evaluated and treated and all questions answered.  Assessment and Plan Assessment & Plan -Recommend to continue with physical therapy and home stretching exercises - Instructed to ice heel for 20 minutes, twice daily. - Prescribed Medrol  Dosepak for inflammation. - Advised against  ibuprofen  while on steroids; use ibuprofen  post-steroids as needed. -Continue night splint for rest periods to maintain foot position and allow icing.  - Encouraged consistent use of orthotic inserts and good arch support.  No follow-ups on file.  Donnice JONELLE Fees DPM

## 2024-02-27 NOTE — Therapy (Signed)
 OUTPATIENT PHYSICAL THERAPY NOTE      Patient Name: Anna Pugh MRN: [id number] DOB:24-Feb-1971, 53 y.o., female Today's Date: 02/28/2024  END OF SESSION:  PT End of Session - 02/28/24 1714     Visit Number 3    Number of Visits 6    Date for Recertification  04/12/24    Authorization Type [employer]    PT Start Time 1630    PT Stop Time 1715    PT Time Calculation (min) 45 min    Activity Tolerance Patient tolerated treatment well    Behavior During Therapy [facility] for tasks assessed/performed          PT End of Session - 02/28/24 1714     Visit Number 3    Number of Visits 6    Date for Recertification  04/12/24    Authorization Type [employer]    PT Start Time 1630    PT Stop Time 1715    PT Time Calculation (min) 45 min    Activity Tolerance Patient tolerated treatment well    Behavior During Therapy WFL for tasks assessed/performed            Past Medical History:  Diagnosis Date   GERD (gastroesophageal reflux disease)    Localization-related (focal) (partial) epilepsy and epileptic syndromes with complex partial seizures, without mention of intractable epilepsy 01/04/2013   Memory deficit    Migraine without aura, without mention of intractable migraine without mention of status migrainosus 01/04/2013   Obesity    Seizures (HCC)    Past Surgical History:  Procedure Laterality Date   TONSILLECTOMY     Patient Active Problem List   Diagnosis Date Noted   Fatigue 10/09/2018   Advice given about COVID-19 virus by telephone 10/09/2018   Partial epilepsy with impairment of consciousness (HCC) 01/04/2013   Migraine without aura 01/04/2013    PCP: Lorren Greig PARAS, NP  REFERRING PROVIDER: Gershon Donnice SAUNDERS, DPM  REFERRING DIAG: Achilles tendinitis, left leg [M76.62], Plantar fasciitis, left [M72.2]   THERAPY DIAG:  Pain in left foot  Difficulty in walking, not elsewhere classified  Rationale for Evaluation and Treatment: Rehabilitation  ONSET DATE: 01/04/2024  date of referral  SUBJECTIVE:   SUBJECTIVE STATEMENT: 02/28/2024  She states she has resumed taking a steroid and her pain has been better. Pt notes she was able to walk on the treadmill 1 mile yesterday.   EVAL:  Patient reports that her plantar fasciitis pain has been present for most of this year. She states that she initially responded well to injections for her pain, and its not longer responding to that. She has also tried the recommended inserts to her shoes. She reports that this pain is affective everything, including her weight gain, d/t decreased activity tolerance. She is frustrated with not being able to go the gym.  PERTINENT HISTORY: Relevant PMHx includes obesity, seizures/partial epilepsy, migraines  PAIN:  Are you having pain?  Yes: NPRS scale: 1/10 current, 10/10 worst  Pain location: L heel pain Aggravating factors: prolonged standing/walking,  Relieving factors: slippers that are ice packs, use of brace at night   PRECAUTIONS: None  RED FLAGS: None   WEIGHT BEARING RESTRICTIONS: No  FALLS:  Has patient fallen in last 6 months? 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  DIAGNOSTIC FINDINGS:   Per referring provider: 01/04/2024  RADIOLOGY Foot X-ray: Calcaneal spur on the plantar aspect of the heel  PATIENT SURVEYS:  LEFS   Extreme difficulty/unable (0), Quite a bit of difficulty (1), Moderate difficulty (2), Little difficulty (3), No difficulty (4) Survey date:  02/01/2024  Any of your usual work, housework or school activities 3  2. Usual hobbies, recreational or sporting activities 1  3.  Getting into/out of the bath 4  4. Walking between rooms 4  5. Putting on socks/shoes 4  6. Squatting  4  7. Lifting an object, like a bag of groceries from the floor 4  8. Performing light activities around your home 4  9. Performing heavy activities around your home 3  10. Getting into/out of a car 4  11. Walking 2 blocks 1  12. Walking 1 mile 0  13. Going up/down 10 stairs (1 flight) 2  14. Standing for 1 hour 2  15.  sitting for 1 hour 4  16. Running on even ground 0  17. Running on uneven ground 0  18. Making sharp turns while running fast 0  19. Hopping  0  20. Rolling over in bed 4  Score total:  48/80     COGNITION: Overall cognitive status: Within functional limits for tasks assessed     PALPATION: No tenderness to palpation along planter surface of foot, achilles tendon, nor gastroc/soleus mm belly  Mild tenderness to palpation along plantar surface of calcaneus   LOWER EXTREMITY ROM:  Active ROM Right eval Left eval  Hip flexion    Hip extension    Hip abduction    Hip adduction    Hip internal rotation    Hip external rotation    Knee flexion    Knee extension    Ankle dorsiflexion  10  Ankle plantarflexion  45  Ankle inversion  35  Ankle eversion  8   (Blank rows = not tested)  LOWER EXTREMITY MMT:  MMT Right eval Left eval  Hip flexion    Hip extension    Hip abduction    Hip adduction    Hip internal rotation    Hip external rotation    Knee flexion    Knee extension    Ankle dorsiflexion    Ankle plantarflexion    Ankle inversion    Ankle eversion     (Blank rows = not tested)  LOWER EXTREMITY SPECIAL TESTS:  deferred  FUNCTIONAL TESTS:  deferred  GAIT: Distance walked: 100 ft within clinic Assistive device utilized: None Level of assistance: Complete Independence Comments: mild antalgic gait pattern after sitting, with decreased L stance time, decreased ankle DF at initial contact  TREATMENT DATE:  [facility] Adult PT Treatment:                                                DATE: 02/28/24 Therapeutic Exercise: NuStep lvl 4 x 6 minutes  Gastroc stretch with stretch pillow case 3 x 30 sec  Seated ankle PF 2x8 10# 10 hold Standing ankle PF x5 10 hold- pt reported increase in discomfort and this ex was stopped Manual Therapy: STM to the L gastroc and soleus muscles CFM to the achilles insertion Self Care: Use of Nustep at her gym for warm up prior to walking on the treadmill or for cardio alternative Load management of HEP and activity to promote tissue remodeling vs. irritation  [facility] Adult PT Treatment:                                                DATE: 02/21/24  Therapeutic Exercise: NuStep lvl 4 x 6 minutes  Ankle Pumps x 20  Gastroc stretch with stretch strap 3 x 30 sec   Manual Therapy: Grade 2 mid foot mobilization   Neuromuscular re-ed: Toe scrunches with towel  Toe yoga - alternating toe extension  Therapeutic Activity:  Patient education and collaborative planning for progression of weight bearing activities at gym in order to reach patient's goal of being about to walk 1 mile to the store and 1 mile back to her home without pain .   [facility] Adult PT Treatment:                                                DATE: 02/01/2024  Initial evaluation: see patient education and home exercise program as noted below     PATIENT EDUCATION:  Education details: reviewed initial home exercise program; discussion of POC, prognosis and goals for skilled PT   Person educated: Patient Education method: Explanation, Demonstration, and Handouts Education comprehension: verbalized understanding, returned demonstration, and needs further education  HOME EXERCISE PROGRAM: Access Code: [email or web address] URL: [email or web address] Date: 02/28/2024 Prepared by: Dasie Daft  Exercises - Soleus Stretch on Wall  - 2 x daily - 7 x weekly - 2 sets - 20 sec hold - Long Sitting Calf Stretch with Strap  - 2 x daily - 7 x weekly - 2 sets - 20 sec hold - Seated Ankle Alphabet  - 1 x daily - 7 x weekly - 1 sets - Seated Ankle Pumps on Table  - 1 x daily - 7 x weekly - 10-15 reps - Seated Ankle Dorsiflexion with Resistance  - 1 x daily - 3 x weekly - 2 sets - 10 reps - Seated Ankle Eversion with Resistance  - 1 x daily - 3 x weekly - 2 sets - 10 reps - Seated Ankle Plantar Flexion with Resistance Loop  - 1 x daily - 3 x weekly - 2 sets - 10 reps - Seated Ankle Inversion with Resistance  - 1 x daily - 3 x weekly - 2 sets - 10 reps - Seated Heel Raise  - 1 x daily - 7 x weekly -  2-3 sets - 8-10 reps - 10 hold  Patient Education - Plantar Fasciitis - Achilles Tendinopathy    ASSESSMENT:  CLINICAL IMPRESSION: Pt attended her 3rd PT session today. STM and CFM were completed as documented above. Increased muscle tightness and tenderness was palpated of the medial calf. Exs were completed to address L ankle/foot flexibility and strengthening to promote tissue remodeling. Instructed pt in load management as a way to improve load tolerance while minimizing irritation to the achilles. Pt voiced understanding. Pt is making progress re: pain and tolerance to activity. Pt will benefit for continued PT for 3 visit over 6 more weeks to assist in the rehab process of her L ankle/foot  EVAL: Anna Pugh is a 53 y.o. female who was seen today for physical therapy evaluation and treatment for persistent L heel pain with altered gait mechanics. She has normal ankle AROM and point tenderness at plantar surface of heel. She has related pain and difficulty with long periods of standing, and walking. She requires skilled PT services at this time to address relevant deficits and improve overall function.     OBJECTIVE IMPAIRMENTS: Abnormal gait, decreased activity tolerance, decreased endurance,  difficulty walking, and pain.   ACTIVITY LIMITATIONS: standing, squatting, stairs, and locomotion level  PARTICIPATION LIMITATIONS: meal prep, cleaning, laundry, interpersonal relationship, shopping, community activity, and occupation  PERSONAL FACTORS: Time since onset of injury/illness/exacerbation and 3+ comorbidities: Relevant PMHx includes obesity, seizures/partial epilepsy, migraines are also affecting patient's functional outcome.   REHAB POTENTIAL: Fair    CLINICAL DECISION MAKING: Stable/uncomplicated  EVALUATION COMPLEXITY: Low   GOALS: Goals reviewed with patient? YES  SHORT TERM GOALS = LONG TERM GOALS: Target date: 04/12/2024   Patient will be independent with initial home program at least 3 days/week.  Baseline: provided at eval Goal Status: MET  Patient will report improved overall functional ability with LEFS score of 58/80 or greater.  Baseline: 48/80 Goal Status: ONGOING  3.  Patient will demonstrate ability to tolerate at least 20 minutes of weight bearing activity, with no more than minimal pain in order to demonstrate ability to return to walking for exercise and occupational duties.  Baseline: moderate-to-severe pain with standing and walking, unable to walk on treadmill for normal exercises  Goal status: ONGOING      PLAN:  PT FREQUENCY: 1x/week  PT DURATION: 4 weeks  PLANNED INTERVENTIONS: 02835- PT Re-evaluation, 97750- Physical Performance Testing, 97110-Therapeutic exercises, 97530- Therapeutic activity, 97112- Neuromuscular re-education, 97535- Self Care, 02859- Manual therapy, [phone]- Aquatic Therapy, [phone] (1-2 muscles), 20561 (3+ muscles)- Dry Needling, Patient/Family education, Taping, Joint mobilization, Cryotherapy, and Moist heat  PLAN FOR NEXT SESSION: address foot intrinsic mm, low impact aerobic activity for pain modulation effects, manual therapy and modalities as indicated, assess midfoot joint mobility, updated HEP  Ayo Guarino  MS, PT 02/28/24 6:09 PM

## 2024-02-28 ENCOUNTER — Ambulatory Visit: Payer: Self-pay

## 2024-02-28 ENCOUNTER — Encounter

## 2024-02-28 DIAGNOSIS — M79672 Pain in left foot: Secondary | ICD-10-CM | POA: Diagnosis not present

## 2024-02-28 DIAGNOSIS — R262 Difficulty in walking, not elsewhere classified: Secondary | ICD-10-CM

## 2024-03-04 ENCOUNTER — Other Ambulatory Visit: Payer: Self-pay

## 2024-03-04 DIAGNOSIS — G40209 Localization-related (focal) (partial) symptomatic epilepsy and epileptic syndromes with complex partial seizures, not intractable, without status epilepticus: Secondary | ICD-10-CM

## 2024-03-04 MED ORDER — LACOSAMIDE 50 MG PO TABS
50.0000 mg | ORAL_TABLET | Freq: Two times a day (BID) | ORAL | 1 refills | Status: AC
Start: 2024-03-04 — End: ?

## 2024-03-18 ENCOUNTER — Encounter (HOSPITAL_COMMUNITY): Payer: Self-pay | Admitting: *Deleted

## 2024-03-18 ENCOUNTER — Ambulatory Visit (HOSPITAL_COMMUNITY)
Admission: EM | Admit: 2024-03-18 | Discharge: 2024-03-18 | Disposition: A | Discharge status: Home / Self Care | Attending: Internal Medicine | Admitting: Internal Medicine

## 2024-03-18 ENCOUNTER — Other Ambulatory Visit: Payer: Self-pay

## 2024-03-18 DIAGNOSIS — J069 Acute upper respiratory infection, unspecified: Secondary | ICD-10-CM | POA: Diagnosis not present

## 2024-03-18 LAB — POC COVID19/FLU A&B COMBO
Covid Antigen, POC: NEGATIVE
Influenza A Antigen, POC: NEGATIVE
Influenza B Antigen, POC: NEGATIVE

## 2024-03-18 MED ORDER — PROMETHAZINE-DM 6.25-15 MG/5ML PO SYRP: 5.0000 mL | ORAL_SOLUTION | Freq: Every evening | ORAL | 0 refills | Status: AC | PRN

## 2024-03-18 NOTE — ED Triage Notes (Signed)
 C/O facial pressure and nasal congestion onset 2 days ago without fevers. Has been taking Mucinex Sinus Max. Also c/o occasional cough, and initially felt she lost sensation of taste, but has had some come back.

## 2024-03-18 NOTE — Discharge Instructions (Addendum)
 You have a viral illness which will improve on its own with rest, fluids, and medications to help with your symptoms.  COVID and flu tests are both negative.   Tylenol, guaifenesin (plain mucinex), and saline nasal sprays may help relieve symptoms.   Two teaspoons of honey in 1 cup of warm water every 4-6 hours may help with throat pains.  Humidifier in room at nighttime may help soothe cough (clean well daily).   Take Promethazine  DM cough medication to help with your cough at nighttime so that you are able to sleep. Do not drive, drink alcohol, or go to work while taking this medication since it can make you sleepy. Only take this at nighttime.   For chest pain, shortness of breath, inability to keep food or fluids down without vomiting, fever that does not respond to tylenol or motrin , or any other severe symptoms, please go to the ER for further evaluation. Return to urgent care as needed, otherwise follow-up with PCP.

## 2024-03-18 NOTE — ED Provider Notes (Signed)
 [facility]    CSN: [id number] Arrival date & time: 03/18/24  0803      History   Chief Complaint Chief Complaint  Patient presents with   Nasal Congestion    HPI Anna Pugh is a 53 y.o. female.   Anna Pugh is a 53 y.o. female presenting for chief complaint of sore throat, nasal congestion, cough, and generalized fatigue that started 2 days ago on Saturday March 16, 2024.  Cough is minimally productive.  Sore throat is worsened by coughing and swallowing.  Denies recent sick contacts with similar symptoms, though she was recently at a work event/party where there may have been sick people.  Denies history of asthma or COPD, never smoker.  Denies vomiting, diarrhea, abdominal pain, rashes, fever/chills, dizziness, chest pain, and shortness of breath.  No leg swelling or orthopnea.  Taking Mucinex sinus max and other over-the-counter medications with minimal relief in symptoms.     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  Authorizing Provider  atorvastatin  (LIPITOR) 20 MG tablet TAKE 1 TABLET(20 MG) BY MOUTH DAILY 02/21/24  Yes Lorren Greig PARAS, NP  carbamazepine  ([name] ) 200 MG 12 hr capsule Take 1 capsule (200mg ) in the am and 3 capsules (600mg ) in the evening 10/18/23  Yes Lomax, Kadee, NP  cetirizine (ZYRTEC) 10 MG tablet Take 10 mg by mouth daily.   Yes [provider]  cholecalciferol (VITAMIN D) 1000 units tablet Take 1,000 Units by mouth daily.   Yes [provider]  lacosamide  (VIMPAT ) 50 MG TABS tablet Take 1 tablet (50 mg total) by mouth 2 (two) times daily. 03/04/24  Yes Lomax, Roniya, NP  rizatriptan  (MAXALT ) 10 MG tablet Take 1 tablet (10 mg total) by mouth daily as needed for migraine. May repeat in 2 hours if needed 08/07/23  Yes Lomax, Chicquita, NP  b complex vitamins capsule Take 1 capsule by mouth daily.    [provider]  methylPREDNISolone  (MEDROL  DOSEPAK) 4 MG TBPK tablet Take as directed 02/22/24   Gershon Donnice SAUNDERS, DPM  Multiple Vitamin (MULTIVITAMIN PO) Take 1 tablet by mouth daily.    [provider]  phentermine  15 MG capsule Take 1 capsule (15 mg total) by mouth every morning. Patient not taking: No sig reported 10/25/23   Stephens, Jennetta  J, NP  triamcinolone  cream (KENALOG ) 0.1 % Apply 1 Application topically 2 (two) times daily. 01/25/23   Loreda Myla SAUNDERS, NP    Family History Family History  Problem Relation Age of Onset   Stroke Mother    Hypertension Mother    Migraines Father    Myasthenia gravis Father    Cancer Brother     Social History Social History   Tobacco Use   Smoking status: Never   Smokeless tobacco: Never  Vaping Use   Vaping status: Never Used  Substance Use Topics   Alcohol use: No   Drug use: No     Allergies   Keppra [levetiracetam], Lamotrigine, Topamax [topiramate], and Azithromycin   Review of Systems Review of Systems Per HPI  Physical Exam Triage Vital Signs ED Triage Vitals [03/18/24 0817]  Encounter Vitals  Group     BP 118/81     Girls Systolic BP Percentile      Girls Diastolic BP Percentile      Boys Systolic BP Percentile      Boys Diastolic BP Percentile      Pulse Rate 89     Resp 20     Temp 99 F (37.2 C)     Temp Source Oral     SpO2 96 %     Weight      Height      Head Circumference      Peak Flow      Pain Score 4     Pain Loc      Pain Education      Exclude from Growth Chart    No data found.  Updated Vital Signs BP 118/81   Pulse 89   Temp 99 F (37.2 C) (Oral)   Resp 20   SpO2 96%   Visual Acuity Right Eye Distance:   Left Eye Distance:   Bilateral Distance:    Right Eye Near:   Left Eye Near:    Bilateral Near:     Physical Exam Vitals and nursing note reviewed.  Constitutional:      Appearance: She is not ill-appearing or toxic-appearing.  HENT:     Head: Normocephalic and atraumatic.     Right Ear: Hearing, tympanic membrane, ear canal and external ear normal.     Left Ear: Hearing, tympanic membrane, ear canal and external ear normal.     Nose: Congestion present.     Mouth/Throat:     Lips: Pink.     Mouth: Mucous membranes are moist. No injury or oral lesions.     Dentition: Normal dentition.     Tongue: No lesions.     Pharynx: Oropharynx is clear. Uvula midline. Posterior oropharyngeal erythema present. No pharyngeal swelling, oropharyngeal exudate, uvula swelling or postnasal drip.     Tonsils: No tonsillar exudate.     Comments: Mild erythema to posterior oropharynx with small amount of clear postnasal drainage visualized. Eyes:     General: Lids are normal. Vision grossly intact. Gaze aligned appropriately.     Extraocular Movements: Extraocular movements intact.     Conjunctiva/sclera: Conjunctivae normal.  Neck:     Trachea: Trachea and phonation normal.  Cardiovascular:     Rate and Rhythm: Normal rate and regular rhythm.     Heart sounds: Normal heart sounds, S1 normal and S2 normal.  Pulmonary:     Effort: Pulmonary  effort is normal. No respiratory distress.     Breath sounds: Normal breath sounds and air  entry.  Musculoskeletal:     Cervical back: Neck supple.  Lymphadenopathy:     Cervical: No cervical adenopathy.  Skin:    General: Skin is warm and dry.     Capillary Refill: Capillary refill takes less than 2 seconds.     Findings: No rash.  Neurological:     General: No focal deficit present.     Mental Status: She is alert and oriented to person, place, and time. Mental status is at baseline.     Cranial Nerves: No dysarthria or facial asymmetry.  Psychiatric:        Mood and Affect: Mood normal.        Speech: Speech normal.        Behavior: Behavior normal.        Thought Content: Thought content normal.        Judgment: Judgment normal.      UC Treatments / Results  Labs (all labs ordered are listed, but only abnormal results are displayed) Labs Reviewed  POC COVID19/FLU A&B COMBO    EKG   Radiology No results found.  Procedures Procedures (including critical care time)  Medications Ordered in UC Medications - No data to display  Initial Impression / Assessment and Plan / UC Course  I have reviewed the triage vital signs and the nursing notes.  Pertinent labs & imaging results that were available during my care of the patient were reviewed by me and considered in my medical decision making (see chart for details).   1.  Viral URI with cough Suspect viral URI, viral syndrome.  Strep/viral testing: POC COVID and flu are negative.  Physical exam findings reassuring, vital signs hemodynamically stable, and lungs clear, therefore deferred imaging of the chest.  Advised supportive care/prescriptions for symptomatic relief as outlined in AVS.    Counseled patient on potential for adverse effects with medications prescribed/recommended today, strict ER and return-to-clinic precautions discussed, patient verbalized understanding.    Final Clinical Impressions(s) / UC  Diagnoses   Final diagnoses:  Viral URI with cough     Discharge Instructions      You have a viral illness which will improve on its own with rest, fluids, and medications to help with your symptoms.  Tylenol, guaifenesin (plain mucinex), and saline nasal sprays may help relieve symptoms.   Two teaspoons of honey in 1 cup of warm water every 4-6 hours may help with throat pains.  Humidifier in room at nighttime may help soothe cough (clean well daily).   Take Promethazine  DM cough medication to help with your cough at nighttime so that you are able to sleep. Do not drive, drink alcohol, or go to work while taking this medication since it can make you sleepy. Only take this at nighttime.   For chest pain, shortness of breath, inability to keep food or fluids down without vomiting, fever that does not respond to tylenol or motrin , or any other severe symptoms, please go to the ER for further evaluation. Return to urgent care as needed, otherwise follow-up with PCP.      ED Prescriptions   None    PDMP not reviewed this encounter.   Enedelia Going [place], [place] 03/18/24 [phone]

## 2024-03-19 ENCOUNTER — Ambulatory Visit

## 2024-03-21 ENCOUNTER — Ambulatory Visit (HOSPITAL_COMMUNITY)
Admission: EM | Admit: 2024-03-21 | Discharge: 2024-03-21 | Disposition: A | Discharge status: Home / Self Care | Attending: Nurse Practitioner | Admitting: Nurse Practitioner

## 2024-03-21 ENCOUNTER — Encounter (HOSPITAL_COMMUNITY): Payer: Self-pay

## 2024-03-21 DIAGNOSIS — R051 Acute cough: Secondary | ICD-10-CM

## 2024-03-21 DIAGNOSIS — J069 Acute upper respiratory infection, unspecified: Secondary | ICD-10-CM | POA: Diagnosis not present

## 2024-03-21 DIAGNOSIS — R109 Unspecified abdominal pain: Secondary | ICD-10-CM | POA: Diagnosis not present

## 2024-03-21 MED ORDER — AMOXICILLIN-POT CLAVULANATE 875-125 MG PO TABS: 1.0000 | ORAL_TABLET | Freq: Two times a day (BID) | ORAL | 0 refills | Status: AC

## 2024-03-21 NOTE — ED Provider Notes (Signed)
 [facility]    CSN: [id number] Arrival date & time: 03/21/24  0806      History   Chief Complaint Chief Complaint  Patient presents with   Cough    HPI Anna Pugh is a 53 y.o. female.   Patient presents requesting evaluation for continued nasal congestion, sinus pressure, cough, headache, and generally feeling unwell.  Symptom onset was last Saturday.  She was evaluated here in this urgent care on 03/18/2024 and diagnosed with a viral URI.  She was given a prescription for Promethazine  DM cough syrup to help with symptom management.  Patient has been taking Mucinex to help manage symptoms.  She also reports some mild generalized abdominal discomfort and fullness.  Normally has a bowel movement every day but states there is a 2-day period where she did not.  Last normal bowel movement was yesterday.  She had 2 episodes of vomiting earlier this week but none since.  No reported fever, chest pain, shortness of breath, or rash.  The history is provided by the patient.  Cough Associated symptoms: headaches   Associated symptoms: no chest pain, no chills, no fever, no myalgias, no rash, no shortness of breath and no sore throat     Past Medical History:  Diagnosis Date   GERD (gastroesophageal reflux disease)    Localization-related (focal) (partial) epilepsy and epileptic syndromes with complex partial seizures, without mention of intractable epilepsy 01/04/2013   Memory deficit    Migraine without aura, without mention of intractable migraine without mention of status migrainosus 01/04/2013   Obesity    Seizures (HCC)     Patient Active Problem List   Diagnosis Date Noted   Fatigue 10/09/2018   Advice given about COVID-19 virus by telephone 10/09/2018   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 Authorizing Provider  amoxicillin -clavulanate (AUGMENTIN ) 875-125 MG tablet Take 1 tablet by mouth every 12 (twelve) hours. 03/21/24  Yes Janet Therisa PARAS, FNP  atorvastatin  (LIPITOR) 20 MG tablet TAKE 1 TABLET(20 MG) BY MOUTH DAILY 02/21/24  Yes Jaycee Greig PARAS, NP  b complex vitamins capsule Take 1 capsule by mouth daily.   Yes [provider]  carbamazepine  ([name] ) 200 MG 12 hr capsule Take 1 capsule (200mg ) in the am and 3 capsules (600mg ) in the evening 10/18/23  Yes Lomax, Brylie, NP  cetirizine (ZYRTEC) 10 MG tablet Take 10 mg by mouth daily.   Yes [provider]  cholecalciferol (VITAMIN D) 1000 units tablet Take 1,000 Units by mouth daily.   Yes [provider]  lacosamide  (VIMPAT ) 50 MG TABS tablet Take 1 tablet (50 mg total) by mouth 2 (two) times daily. 03/04/24  Yes Lomax, Umaima, NP  Multiple Vitamin (MULTIVITAMIN PO) Take 1 tablet by mouth daily.   Yes [provider]  promethazine -dextromethorphan (PROMETHAZINE -DM) 6.25-15  MG/5ML syrup Take 5 mLs by mouth at bedtime as needed for cough. 03/18/24  Yes Enedelia Dorna HERO, FNP  rizatriptan  (MAXALT ) 10 MG tablet Take 1 tablet (10 mg total) by mouth daily as needed for migraine. May repeat in 2 hours if needed 08/07/23  Yes Lomax, Shatana, NP  triamcinolone  cream (KENALOG ) 0.1 % Apply 1 Application topically 2 (two) times daily. 01/25/23  Yes Loreda Myla SAUNDERS, NP  methylPREDNISolone  (MEDROL  DOSEPAK) 4 MG TBPK tablet Take as directed 02/22/24   Gershon Donnice SAUNDERS, DPM  phentermine  15 MG capsule Take 1 capsule (15 mg total) by mouth every morning. 10/25/23   Jaycee Greig PARAS, NP    Family History Family History  Problem Relation Age of Onset   Stroke Mother    Hypertension Mother     Migraines Father    Myasthenia gravis Father    Cancer Brother     Social History Social History   Tobacco Use   Smoking status: Never   Smokeless tobacco: Never  Vaping Use   Vaping status: Never Used  Substance Use Topics   Alcohol use: No   Drug use: No     Allergies   Keppra [levetiracetam], Lamotrigine, Topamax [topiramate], and Azithromycin   Review of Systems Review of Systems  Constitutional:  Positive for fatigue. Negative for chills and fever.  HENT:  Positive for congestion, sinus pressure and sinus pain. Negative for sore throat.   Eyes:  Negative for pain and redness.  Respiratory:  Positive for cough. Negative for chest tightness and shortness of breath.   Cardiovascular:  Negative for chest pain and palpitations.  Gastrointestinal:  Positive for abdominal pain and constipation. Negative for diarrhea and vomiting.  Genitourinary:  Negative for dysuria and urgency.  Musculoskeletal:  Negative for back pain and myalgias.  Skin:  Negative for rash.  Neurological:  Positive for headaches. Negative for dizziness.  Psychiatric/Behavioral:  Negative for hallucinations and sleep disturbance.      Physical Exam Triage Vital Signs ED Triage Vitals  Encounter Vitals Group     BP 03/21/24 0833 130/86     Girls Systolic BP Percentile --      Girls Diastolic BP Percentile --      Boys Systolic BP Percentile --      Boys Diastolic BP Percentile --      Pulse Rate 03/21/24 0833 64     Resp 03/21/24 0833 20     Temp 03/21/24 0833 97.8 F (36.6 C)     Temp Source 03/21/24 0833 Oral     SpO2 03/21/24 0833 98 %     Weight --      Height 03/21/24 0833 5' 2 (1.575 m)     Head Circumference --      Peak Flow --      Pain Score 03/21/24 0830 3     Pain Loc --      Pain Education --      Exclude from Growth Chart --    No data found.  Updated Vital Signs BP 130/86 (BP Location: Right Wrist)   Pulse 64   Temp 97.8 F (36.6 C) (Oral)   Resp 20   Ht 5' 2  (1.575 m)   SpO2 98%   BMI 40.24 kg/m   Visual Acuity Right Eye Distance:   Left Eye Distance:   Bilateral Distance:    Right Eye Near:   Left Eye Near:    Bilateral Near:     Physical Exam Vitals  and nursing note reviewed.  Constitutional:      General: She is not in acute distress.    Appearance: Normal appearance.  HENT:     Head: Normocephalic.     Right Ear: Tympanic membrane and ear canal normal.     Left Ear: There is impacted cerumen.     Nose: Congestion (Diffuse frontal sinus pressure) present.     Mouth/Throat:     Mouth: Mucous membranes are moist.     Pharynx: No posterior oropharyngeal erythema.  Eyes:     Extraocular Movements: Extraocular movements intact.     Pupils: Pupils are equal, round, and reactive to light.  Cardiovascular:     Rate and Rhythm: Normal rate and regular rhythm.     Pulses: Normal pulses.  Pulmonary:     Effort: Pulmonary effort is normal.     Breath sounds: Normal breath sounds. No wheezing or rhonchi.  Abdominal:     General: Bowel sounds are normal. There is no distension.     Tenderness: There is abdominal tenderness (mild LLQ). There is no guarding.  Musculoskeletal:     Cervical back: Normal range of motion.  Skin:    General: Skin is warm and dry.  Neurological:     General: No focal deficit present.     Mental Status: She is alert and oriented to person, place, and time.  Psychiatric:        Mood and Affect: Mood normal.        Behavior: Behavior normal.        Thought Content: Thought content normal.        Judgment: Judgment normal.      UC Treatments / Results  Labs (all labs ordered are listed, but only abnormal results are displayed) Labs Reviewed - No data to display  EKG   Radiology No results found.  Procedures Procedures (including critical care time)  Medications Ordered in UC Medications - No data to display  Initial Impression / Assessment and Plan / UC Course  I have reviewed the triage  vital signs and the nursing notes.  Pertinent labs & imaging results that were available during my care of the patient were reviewed by me and considered in my medical decision making (see chart for details).     Patient presents for evaluation of URI symptoms that been ongoing since last week.  She states that the symptoms began to improve with usage of Mucinex and Promethazine  DM cough syrup but have since worsened since yesterday.  She is experiencing moderate frontal sinus discomfort, nasal congestion, cough, and generally feeling unwell.  Based upon secondary worsening symptoms, reasonable at this time to provide antibiotic therapy.  I discussed continued use of OTC medications for symptom management.  Patient reports that her normal bowel movement pattern was daily-since she has been sick there was a change to that pattern.  Now she is concerned about abdominal fullness and mild left lower quadrant tenderness.  I discussed use of 1 capful of MiraLAX until her stools become soft and regular again.  Red flags reviewed which would warrant return evaluation. Final Clinical Impressions(s) / UC Diagnoses   Final diagnoses:  Acute upper respiratory infection  Acute cough  Abdominal discomfort     Discharge Instructions      May continue to use OTC medications such as Mucinex, Tylenol, Motrin .  Ensure adequate oral hydration.  Since your symptoms have been ongoing and are worsening, you have been prescribed antibiotic therapy. For the  constipation, may use one capful of Miralax daily until stools become regular.  Discontinue if the stools become liquid.     ED Prescriptions     Medication Sig Dispense Auth. Provider   amoxicillin -clavulanate (AUGMENTIN ) 875-125 MG tablet Take 1 tablet by mouth every 12 (twelve) hours. 14 tablet Janet Therisa PARAS, FNP      PDMP not reviewed this encounter.   Janet Therisa PARAS, FNP 03/21/24 [phone]

## 2024-03-21 NOTE — ED Triage Notes (Signed)
 Patient presenting with congestion, sinus pressure, and cough. Was seen Monday for the same symptoms. Was starting to feel better. States when coughing to much she with urinate herself. States everything that comes up is clear.   Tried sinus max  with mild relief.

## 2024-03-21 NOTE — Discharge Instructions (Addendum)
 May continue to use OTC medications such as Mucinex, Tylenol, Motrin .  Ensure adequate oral hydration.  Since your symptoms have been ongoing and are worsening, you have been prescribed antibiotic therapy. For the constipation, may use one capful of Miralax daily until stools become regular.  Discontinue if the stools become liquid.

## 2024-05-25 ENCOUNTER — Other Ambulatory Visit: Payer: Self-pay | Admitting: Family

## 2024-05-25 DIAGNOSIS — E785 Hyperlipidemia, unspecified: Secondary | ICD-10-CM

## 2024-05-27 NOTE — Telephone Encounter (Signed)
 Requested Prescriptions  Pending Prescriptions Disp Refills   atorvastatin  (LIPITOR) 20 MG tablet [Pharmacy Med Name: ATORVASTATIN  20MG  TABLETS] 90 tablet 1    Sig: TAKE 1 TABLET(20 MG) BY MOUTH DAILY     Cardiovascular:  Antilipid - Statins Failed - 05/27/2024  3:14 PM      Failed - Lipid Panel in normal range within the last 12 months    Cholesterol, Total  Date Value Ref Range Status  11/15/2023 257 (H) 100 - 199 mg/dL Final   LDL Chol Calc (NIH)  Date Value Ref Range Status  11/15/2023 171 (H) 0 - 99 mg/dL Final   HDL  Date Value Ref Range Status  11/15/2023 73 >39 mg/dL Final   Triglycerides  Date Value Ref Range Status  11/15/2023 78 0 - 149 mg/dL Final         Passed - Patient is not pregnant      Passed - Valid encounter within last 12 months    Recent Outpatient Visits           6 months ago Annual physical exam   [facility], Aariel J, NP   7 months ago Encounter to establish care   [facility], [place], NP   5 years ago Annual physical exam   [facility], Mikel HERO, MD   5 years ago Advice Given About Covid-19 Virus by Telephone   [facility], Olam CROME, MD   6 years ago Tricompartment osteoarthritis of left knee   [facility] [place] [facility], Thurston D, [place]

## 2024-08-06 ENCOUNTER — Ambulatory Visit: Admitting: Family Medicine

## 2024-11-18 ENCOUNTER — Encounter: Admitting: Family
